# Patient Record
Sex: Female | Born: 1945
Health system: Southern US, Community
[De-identification: ages and names within clinical notes are randomized; demographics above are authoritative.]

---

## 1998-02-24 ENCOUNTER — Inpatient Hospital Stay (HOSPITAL_COMMUNITY): Admission: AD | Admit: 1998-02-24 | Discharge: 1998-02-26 | Payer: Self-pay | Admitting: Gastroenterology

## 1999-10-24 ENCOUNTER — Encounter: Payer: Self-pay | Admitting: Obstetrics and Gynecology

## 1999-10-24 ENCOUNTER — Other Ambulatory Visit: Admission: RE | Admit: 1999-10-24 | Discharge: 1999-10-24 | Payer: Self-pay | Admitting: Obstetrics and Gynecology

## 1999-10-24 ENCOUNTER — Encounter: Admission: RE | Admit: 1999-10-24 | Discharge: 1999-10-24 | Payer: Self-pay | Admitting: Obstetrics and Gynecology

## 2001-06-22 ENCOUNTER — Other Ambulatory Visit: Admission: RE | Admit: 2001-06-22 | Discharge: 2001-06-22 | Payer: Self-pay | Admitting: Gynecology

## 2001-10-26 ENCOUNTER — Encounter: Admission: RE | Admit: 2001-10-26 | Discharge: 2001-10-26 | Payer: Self-pay | Admitting: Gynecology

## 2001-10-26 ENCOUNTER — Encounter: Payer: Self-pay | Admitting: Gynecology

## 2001-11-02 ENCOUNTER — Encounter: Payer: Self-pay | Admitting: Gynecology

## 2001-11-02 ENCOUNTER — Encounter: Admission: RE | Admit: 2001-11-02 | Discharge: 2001-11-02 | Payer: Self-pay | Admitting: Gynecology

## 2002-06-28 ENCOUNTER — Other Ambulatory Visit: Admission: RE | Admit: 2002-06-28 | Discharge: 2002-06-28 | Payer: Self-pay | Admitting: Gynecology

## 2003-10-17 ENCOUNTER — Other Ambulatory Visit: Admission: RE | Admit: 2003-10-17 | Discharge: 2003-10-17 | Payer: Self-pay | Admitting: Gynecology

## 2003-11-07 ENCOUNTER — Encounter: Admission: RE | Admit: 2003-11-07 | Discharge: 2003-11-07 | Payer: Self-pay | Admitting: Gynecology

## 2003-11-21 ENCOUNTER — Encounter: Admission: RE | Admit: 2003-11-21 | Discharge: 2003-11-21 | Payer: Self-pay | Admitting: Gynecology

## 2003-11-21 IMAGING — CR DG CHEST 2V
2 series · 2 of 2 positions shown · non-contrast
Comparison: none

CLINICAL DATA: Gagging sensation, cough, recently started Synthroid.  
 CHEST X-RAY: 
 The heart size and mediastinal contours are normal. The lungs are clear. The visualized skeleton is unremarkable.

 IMPRESSION
 No active lung disease.

[view not recorded (1 of 2)]
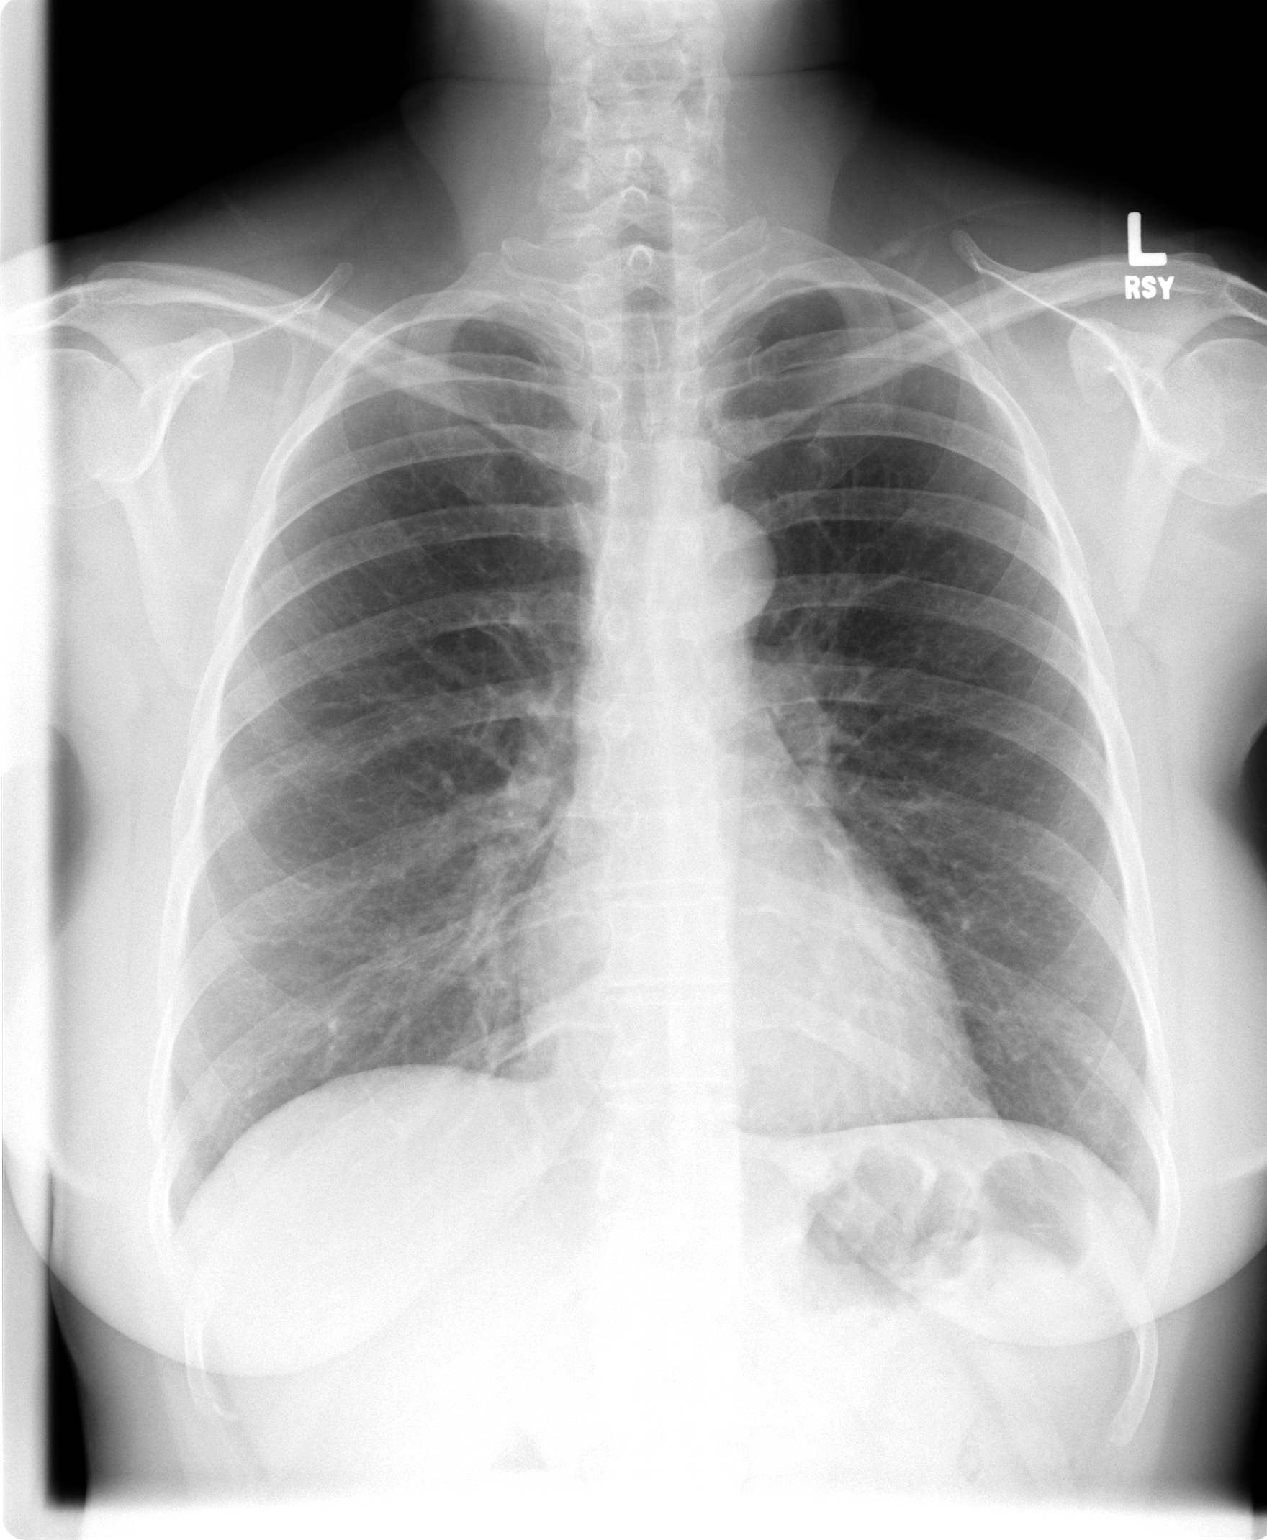

[view not recorded (2 of 2)]
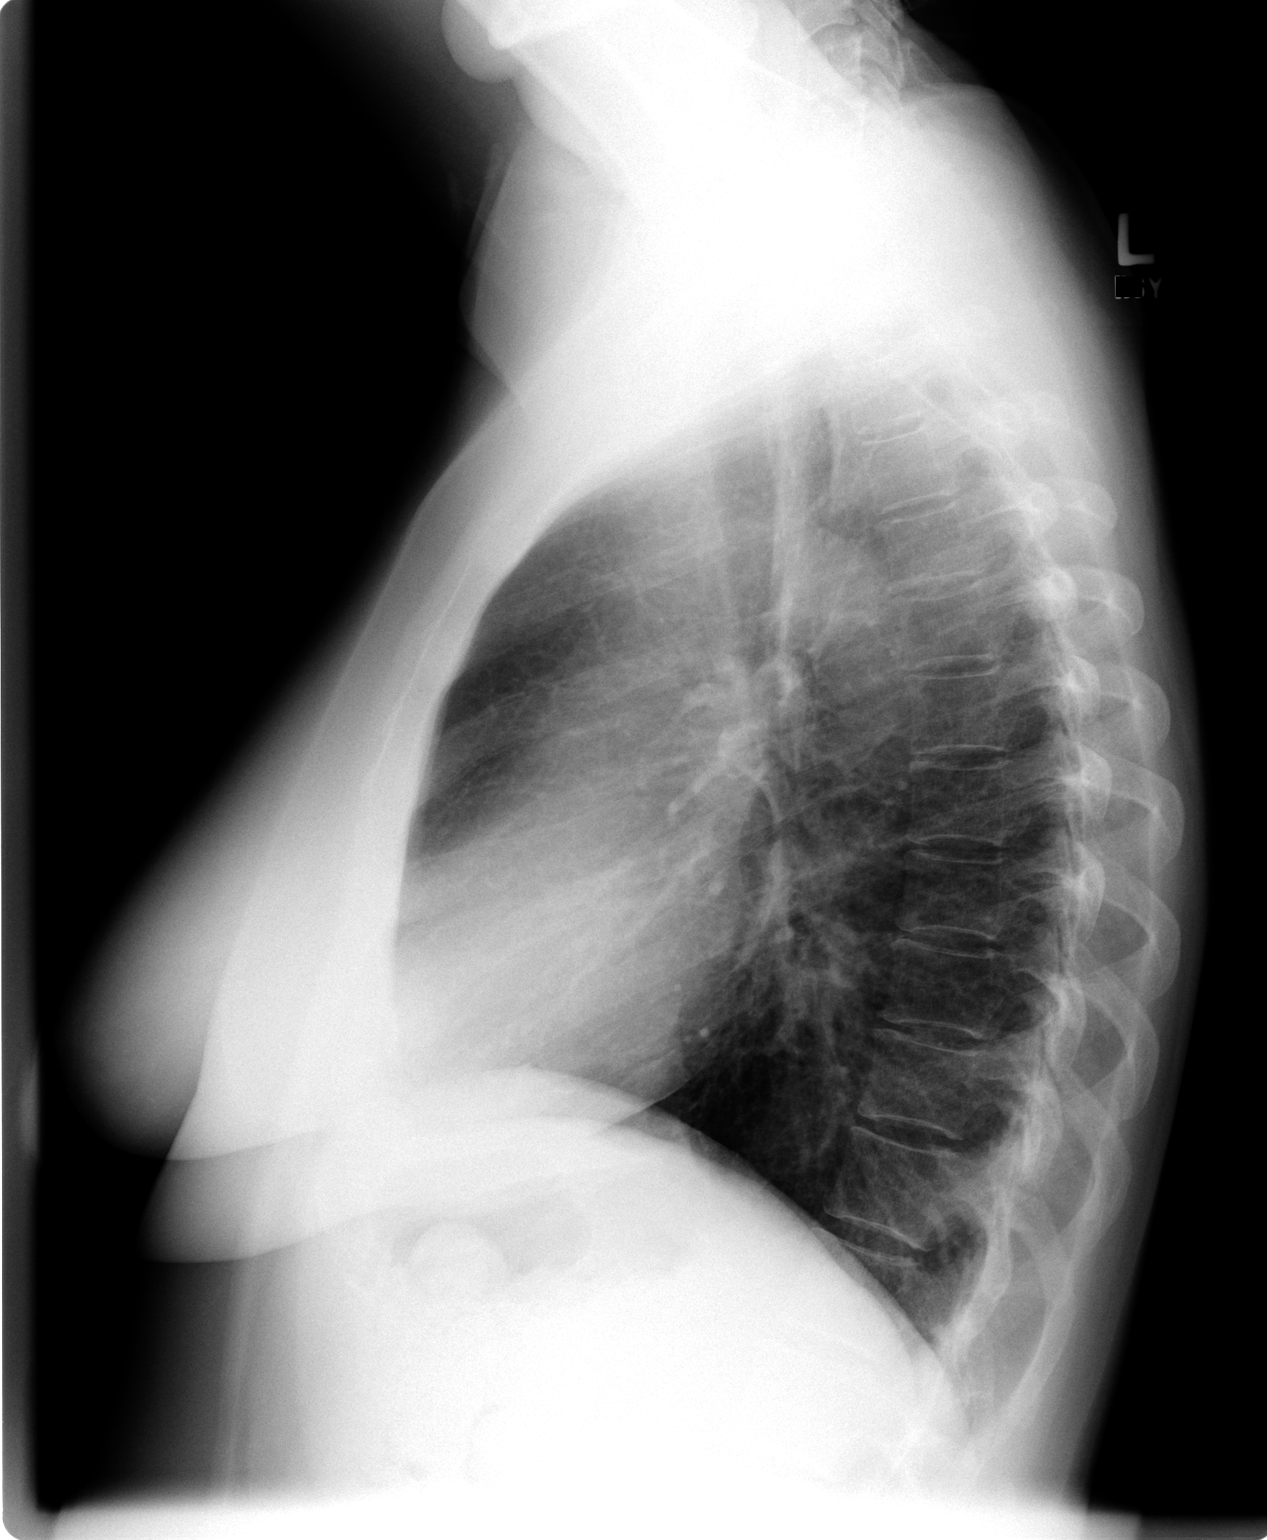

[2 of 2 positions shown; findings below may reference images not displayed]

## 2004-10-31 ENCOUNTER — Other Ambulatory Visit: Admission: RE | Admit: 2004-10-31 | Discharge: 2004-10-31 | Payer: Self-pay | Admitting: Gynecology

## 2004-11-19 ENCOUNTER — Encounter: Admission: RE | Admit: 2004-11-19 | Discharge: 2004-11-19 | Payer: Self-pay | Admitting: Gynecology

## 2005-11-14 ENCOUNTER — Other Ambulatory Visit: Admission: RE | Admit: 2005-11-14 | Discharge: 2005-11-14 | Payer: Self-pay | Admitting: Gynecology

## 2005-12-03 ENCOUNTER — Encounter: Admission: RE | Admit: 2005-12-03 | Discharge: 2005-12-03 | Payer: Self-pay | Admitting: Gynecology

## 2007-02-02 ENCOUNTER — Other Ambulatory Visit: Admission: RE | Admit: 2007-02-02 | Discharge: 2007-02-02 | Payer: Self-pay | Admitting: Gynecology

## 2007-12-22 ENCOUNTER — Ambulatory Visit (HOSPITAL_BASED_OUTPATIENT_CLINIC_OR_DEPARTMENT_OTHER): Admission: RE | Admit: 2007-12-22 | Discharge: 2007-12-23 | Payer: Self-pay | Admitting: Gynecology

## 2008-01-29 ENCOUNTER — Encounter: Admission: RE | Admit: 2008-01-29 | Discharge: 2008-01-29 | Payer: Self-pay | Admitting: Gynecology

## 2008-06-30 ENCOUNTER — Encounter: Admission: RE | Admit: 2008-06-30 | Discharge: 2008-06-30 | Payer: Self-pay | Admitting: Orthopaedic Surgery

## 2009-05-16 ENCOUNTER — Encounter: Admission: RE | Admit: 2009-05-16 | Discharge: 2009-05-16 | Payer: Self-pay | Admitting: Gynecology

## 2010-07-08 ENCOUNTER — Encounter: Payer: Self-pay | Admitting: Gynecology

## 2010-07-09 ENCOUNTER — Encounter: Payer: Self-pay | Admitting: Gynecology

## 2010-10-30 NOTE — Op Note (Signed)
NAMEBARRIE, Kristi              ACCOUNT NO.:  [id number]   MEDICAL RECORD NO.:  [id number]          PATIENT TYPE:  AMB   LOCATION:  [facility]                         FACILITY:  [facility]   PHYSICIAN:  Gretta Cool, M.D. DATE OF BIRTH:  18-Mar-1946   DATE OF PROCEDURE:  12/22/2007  DATE OF DISCHARGE:                               OPERATIVE REPORT   PREOPERATIVE DIAGNOSIS:  Pelvic organ prolapse with enterocele,  rectocele and fascial detachment.   POSTOPERATIVE DIAGNOSIS:  Pelvic organ prolapse with enterocele,  rectocele and fascial detachment.   PROCEDURES:  Posterior enterocele repairs and cardinal uterosacral  colposuspension.   SURGEON:  Gretta Cool, M.D.   ASSISTANT:  Luvenia Redden, M.D.   ANESTHESIA:  General orotracheal.   BRIEF HISTORY:  Kristi Burns is a 65 year old G3, P2 with history of very  long, difficult first labor with pushing for many hours, before Cesarean  section delivery.  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  She has seen urology and they have planned interval  procedure if she has persistent incontinence after repair of her  posterior support.  She is now admitted for definitive therapy of the  posterior vaginal wall concerns.   DESCRIPTION OF PROCEDURE:  Under excellent general anesthesia, as above,  with the patient prepped and draped in lithotomy position, Allen  stirrups, with Foley catheter draining her bladder, the posterior  vaginal walls were grasped with Allis clamps and the mucosa infiltrated  with Xylocaine 1% with epi 1:200,000.  At this point the mucosa was  undermined and incised to the apex of the vaginal cuff.  Allis clamps  were placed on the cut edge and the perirectal fascia dissected from the  mucosa.  A large, fascial defect was confirmed in the center of the  posterior vaginal wall, that extended two-thirds of the length of the  vagina.  At this point the cardinal uterosacral ligaments were  identified and grasped posteriorly as far as possible.  The sutures of 2-  0 Novofil were placed through the cardinal uterosacral complex and then  secured to the detached perirectal fascia.  The fascia was then pulled  to the cardinal uterosacral complex and secured.  At this point the  central portion of the fascial defect was sutured to the posterior  aspect of the cervix.  At this point the weak, central portion of the  perirectal fascia was plicated from the apex of the cuff to the  introitus with a running suture of 0 Vicryl.  A second suture of 2-0  Vicryl was used to plicate the upper layers of endopelvic fascia and  mucosa from the apex of the vaginal cuff to the introitus.  At the end  of the procedure, the posterior fascial defect was well repaired and  there was no evidence of any site of weakness.   At this point, the patient was returned to the recovery room in  excellent condition without complication.  ESTIMATED BLOOD LOSS:  100 cc.   TISSUE:  No tissue submitted to pathology.  The mucosal trimmings were  discarded.           ______________________________  Gretta Cool, M.D.     CWL/MEDQ  D:  12/22/2007  T:  12/22/2007  Job:  [id number]   cc:   Luvenia Redden, M.D.  Fax: [phone]   [facility]  [place], [place]

## 2011-03-14 LAB — POCT I-STAT 4, (NA,K, GLUC, HGB,HCT)
Hemoglobin: 13.6
Sodium: 141

## 2012-02-06 ENCOUNTER — Other Ambulatory Visit: Payer: Self-pay | Admitting: Gynecology

## 2012-02-06 DIAGNOSIS — Z1231 Encounter for screening mammogram for malignant neoplasm of breast: Secondary | ICD-10-CM

## 2012-03-02 ENCOUNTER — Ambulatory Visit
Admission: RE | Admit: 2012-03-02 | Discharge: 2012-03-02 | Disposition: A | Discharge status: Home / Self Care | Payer: Medicare Other | Source: Ambulatory Visit | Attending: Gynecology | Admitting: Gynecology

## 2012-03-02 DIAGNOSIS — Z1231 Encounter for screening mammogram for malignant neoplasm of breast: Secondary | ICD-10-CM

## 2012-03-03 ENCOUNTER — Other Ambulatory Visit: Payer: Self-pay | Admitting: Gynecology

## 2012-03-03 DIAGNOSIS — R928 Other abnormal and inconclusive findings on diagnostic imaging of breast: Secondary | ICD-10-CM

## 2012-03-12 ENCOUNTER — Ambulatory Visit
Admission: RE | Admit: 2012-03-12 | Discharge: 2012-03-12 | Disposition: A | Discharge status: Home / Self Care | Payer: Medicare Other | Source: Ambulatory Visit | Attending: Gynecology | Admitting: Gynecology

## 2012-03-12 DIAGNOSIS — R928 Other abnormal and inconclusive findings on diagnostic imaging of breast: Secondary | ICD-10-CM

## 2014-08-30 ENCOUNTER — Other Ambulatory Visit: Payer: Self-pay | Admitting: Internal Medicine

## 2014-08-30 ENCOUNTER — Other Ambulatory Visit: Payer: Self-pay

## 2014-08-30 DIAGNOSIS — R921 Mammographic calcification found on diagnostic imaging of breast: Secondary | ICD-10-CM

## 2014-08-30 DIAGNOSIS — Z1231 Encounter for screening mammogram for malignant neoplasm of breast: Secondary | ICD-10-CM

## 2014-11-07 ENCOUNTER — Other Ambulatory Visit: Payer: Self-pay | Admitting: Internal Medicine

## 2014-11-07 DIAGNOSIS — R921 Mammographic calcification found on diagnostic imaging of breast: Secondary | ICD-10-CM

## 2014-11-11 ENCOUNTER — Ambulatory Visit
Admission: RE | Admit: 2014-11-11 | Discharge: 2014-11-11 | Disposition: A | Discharge status: Home / Self Care | Payer: Medicare Other | Source: Ambulatory Visit | Attending: Internal Medicine | Admitting: Internal Medicine

## 2014-11-11 ENCOUNTER — Other Ambulatory Visit: Payer: Self-pay

## 2014-11-11 DIAGNOSIS — R921 Mammographic calcification found on diagnostic imaging of breast: Secondary | ICD-10-CM

## 2015-11-15 DIAGNOSIS — Z79899 Other long term (current) drug therapy: Secondary | ICD-10-CM

## 2015-11-15 DIAGNOSIS — R5381 Other malaise: Secondary | ICD-10-CM

## 2015-11-15 DIAGNOSIS — I10 Essential (primary) hypertension: Secondary | ICD-10-CM

## 2015-11-15 DIAGNOSIS — E782 Mixed hyperlipidemia: Secondary | ICD-10-CM

## 2015-11-15 DIAGNOSIS — F419 Anxiety disorder, unspecified: Secondary | ICD-10-CM

## 2015-11-15 DIAGNOSIS — I739 Peripheral vascular disease, unspecified: Secondary | ICD-10-CM

## 2015-11-15 DIAGNOSIS — E039 Hypothyroidism, unspecified: Secondary | ICD-10-CM

## 2015-11-15 HISTORY — DX: Mixed hyperlipidemia: E78.2

## 2015-11-15 HISTORY — DX: Anxiety disorder, unspecified: F41.9

## 2015-11-15 HISTORY — DX: Other long term (current) drug therapy: Z79.899

## 2015-11-15 HISTORY — DX: Peripheral vascular disease, unspecified: I73.9

## 2015-11-15 HISTORY — DX: Essential (primary) hypertension: I10

## 2015-11-15 HISTORY — DX: Hypothyroidism, unspecified: E03.9

## 2015-11-15 HISTORY — DX: Other malaise: R53.81

## 2017-01-31 ENCOUNTER — Encounter (INDEPENDENT_AMBULATORY_CARE_PROVIDER_SITE_OTHER): Payer: Self-pay

## 2017-01-31 ENCOUNTER — Encounter: Payer: Self-pay | Admitting: Sports Medicine

## 2017-01-31 ENCOUNTER — Ambulatory Visit (INDEPENDENT_AMBULATORY_CARE_PROVIDER_SITE_OTHER): Payer: Medicare Other | Admitting: Sports Medicine

## 2017-01-31 VITALS — BP 132/68 | HR 75

## 2017-01-31 DIAGNOSIS — L602 Onychogryphosis: Secondary | ICD-10-CM

## 2017-01-31 DIAGNOSIS — M79675 Pain in left toe(s): Secondary | ICD-10-CM

## 2017-01-31 DIAGNOSIS — L03032 Cellulitis of left toe: Secondary | ICD-10-CM | POA: Diagnosis not present

## 2017-01-31 DIAGNOSIS — B009 Herpesviral infection, unspecified: Secondary | ICD-10-CM

## 2017-01-31 HISTORY — DX: Herpesviral infection, unspecified: B00.9

## 2017-01-31 MED ORDER — AMOXICILLIN-POT CLAVULANATE 875-125 MG PO TABS: 1.0000 | ORAL_TABLET | Freq: Two times a day (BID) | ORAL | 0 refills | Status: DC

## 2017-01-31 NOTE — Progress Notes (Signed)
   Subjective:    Patient ID: Kristi Burns, female    DOB: Mar 11, 1946, 71 y.o.   MRN: [id number]  HPI    Review of Systems  Constitutional: Positive for fatigue.  HENT: Positive for hearing loss and rhinorrhea.   Respiratory: Positive for shortness of breath.   Gastrointestinal: Positive for nausea.  Genitourinary: Positive for frequency.  Musculoskeletal: Positive for back pain.  Skin:       Open sores  Neurological: Positive for dizziness.       Objective:   Physical Exam        Assessment & Plan:

## 2017-01-31 NOTE — Progress Notes (Signed)
Subjective: Kristi Burns is a 71 y.o. female patient presents to office today complaining of a painful incurvated, red, hot, swollen lateral nail border of the 1st toe on the left foot. This has been present for >1 month. Patient has treated this by self trimming. Reports that she wants the whole nail removed. Patient denies fever/chills/nausea/vomitting/any other related constitutional symptoms at this time.  There are no active problems to display for this patient.   No current outpatient prescriptions on file prior to visit.   No current facility-administered medications on file prior to visit.     Allergies  Allergen Reactions  . Codeine Other (See Comments)    unknown  . Other Other (See Comments)    Antihistamines    Objective:  There were no vitals filed for this visit.  General: Well developed, nourished, in no acute distress, alert and oriented x3   Dermatology: Skin is warm, dry and supple bilateral. Left hallux nail appears to be severely incurvated with hyperkeratosis formation at the distal aspects of the lateral nail border with large Ram horn appearance of nail. (+) Erythema. (+) Edema. (+) serosanguous drainage present. The right hallux nail is thick as well but asymptomatic, all remaining nails appear unremarkable at this time, free from acute ingrowning. There are no open sores, lesions or other signs of infection  present.  Vascular: Dorsalis Pedis artery and Posterior Tibial artery pedal pulses are 2/4 bilateral with immedate capillary fill time. Pedal hair growth present. No lower extremity edema.   Neruologic: Grossly intact via light touch bilateral.  Musculoskeletal: Tenderness to palpation of the Left hallux lateral nail fold and entire thick toenail. Muscular strength within normal limits in all groups bilateral.   Assesement and Plan: Problem List Items Addressed This Visit    None    Visit Diagnoses    Onychogryposis of toenail    -  Primary   Relevant Medications   amoxicillin-clavulanate (AUGMENTIN) 875-125 MG tablet   Paronychia of great toe, left       Relevant Medications   valACYclovir (VALTREX) 1000 MG tablet   amoxicillin-clavulanate (AUGMENTIN) 875-125 MG tablet   Toe pain, left       Relevant Medications   amoxicillin-clavulanate (AUGMENTIN) 875-125 MG tablet      -Discussed treatment alternatives and plan of care; Explained permanent/temporary nail avulsion and post procedure course to patient. Patient opt for total permanent removal of left hallux nail - After a verbal and written consent, injected 3 ml of a 50:50 mixture of 2% plain lidocaine and 0.5% plain marcaine in a normal hallux block fashion. Next, a betadine prep was performed. Anesthesia was tested and found to be appropriate.  The offending left hallux nail was then incised from the hyponychium to the epinychium. The offending nail was removed and cleared from the field. The area was curretted for any remaining nail or spicules. Phenol application performed and the area was then flushed with alcohol and dressed with antibiotic cream and a dry sterile dressing. -Patient was instructed to leave the dressing intact for today and begin soaking in a weak solution of betadine or Epsom salt and water tomorrow. Patient was instructed to soak for 15 minutes each day and apply neosporin and a gauze or bandaid dressing each day. -Patient was instructed to monitor the toe for signs of infection and return to office if toe becomes more red, hot or swollen. -Rx Augmentin  -Advised ice, elevation, and tylenol or motrin if needed for pain.  -Patient  is to return in 2-3 weeks for follow up care/nail check or sooner if problems arise.  Landis Martins, DPM

## 2017-01-31 NOTE — Patient Instructions (Signed)

## 2017-02-13 ENCOUNTER — Telehealth: Payer: Self-pay | Admitting: *Deleted

## 2017-02-13 NOTE — Telephone Encounter (Signed)
-----   Message from Landis Martins, [place] sent at 02/13/2017  4:58 PM EDT ----- Contact: [phone] Melody Have patient to switch to using betadine from epsom salt to the great toe for soaking. Let her know when the big toenail is removed it takes a long time to heal can take up to 8 weeks. Finish her Augmentin antibiotics  -Dr. Cannon Kettle ----- Message ----- From: Manfred Shirts Sent: 02/13/2017   2:45 PM To: Melody A Judyann Munson, DPM  Left foot great toe raw, weeping, she's soaking it, not healing.

## 2017-02-13 NOTE — Telephone Encounter (Signed)
Called patient and relayed Dr Leeanne Rio advice. Patient also stated she is having a lot of pain recommended ibuprofen and looking into the neosprin with lidocaine as a topical option.  Reminded patient to keep follow up appointment for next Thursday and call the office if she has any further questions or concerns patient stated understanding.

## 2017-02-20 ENCOUNTER — Encounter (INDEPENDENT_AMBULATORY_CARE_PROVIDER_SITE_OTHER): Payer: Self-pay

## 2017-02-20 ENCOUNTER — Ambulatory Visit (INDEPENDENT_AMBULATORY_CARE_PROVIDER_SITE_OTHER): Payer: Self-pay | Admitting: Sports Medicine

## 2017-02-20 DIAGNOSIS — Z9889 Other specified postprocedural states: Secondary | ICD-10-CM

## 2017-02-20 DIAGNOSIS — L602 Onychogryphosis: Secondary | ICD-10-CM

## 2017-02-20 DIAGNOSIS — L03032 Cellulitis of left toe: Secondary | ICD-10-CM

## 2017-02-20 DIAGNOSIS — M79675 Pain in left toe(s): Secondary | ICD-10-CM

## 2017-02-20 NOTE — Progress Notes (Signed)
Subjective: Kristi Burns is a 71 y.o. female patient returns to office today for follow up evaluation after having Left Hallux total permanent nail avulsion performed on 01-31-17. Patient has been soaking using betadine alternating with epsom salt and applying topical antibiotic covered with bandaid daily. Patient deniesfever/chills/nausea/vomitting/any other related constitutional symptoms at this time.  Patient completed Augmentin.  There are no active problems to display for this patient.   Current Outpatient Prescriptions on File Prior to Visit  Medication Sig Dispense Refill  . ALPRAZolam (XANAX) 0.5 MG tablet Take 0.5 mg by mouth.    Marland Kitchen amoxicillin-clavulanate (AUGMENTIN) 875-125 MG tablet Take 1 tablet by mouth 2 (two) times daily. 28 tablet 0  . aspirin EC 325 MG tablet Take 325 mg by mouth.    . cyclobenzaprine (FLEXERIL) 5 MG tablet Take 5 mg by mouth.    . DOCOSAHEXAENOIC ACID PO Take 1 g by mouth.    . hydrochlorothiazide (HYDRODIURIL) 25 MG tablet     . levothyroxine (SYNTHROID, LEVOTHROID) 75 MCG tablet     . medroxyPROGESTERone (PROVERA) 2.5 MG tablet     . Multiple Vitamin (MULTIVITAMIN) capsule Take by mouth.    . potassium chloride (MICRO-K) 10 MEQ CR capsule Take by mouth.    . sertraline (ZOLOFT) 50 MG tablet TAKE 1 TABLET BY MOUTH  DAILY    . vitamin B-12 (CYANOCOBALAMIN) 1000 MCG tablet Take by mouth.     No current facility-administered medications on file prior to visit.     Allergies  Allergen Reactions  . Codeine Other (See Comments)    unknown  . Other Other (See Comments)    Antihistamines    Objective:  General: Well developed, nourished, in no acute distress, alert and oriented x3   Dermatology: Skin is warm, dry and supple bilateral. Left hallux nail bed appears to be clean, dry, with mild granular tissue and surrounding eschar/scab. (-) Erythema. (-) Edema. (-) serosanguous drainage present. The remaining nails appear unremarkable at this time.  There are no other lesions or other signs of infection present.  Neurovascular status: Intact. No lower extremity swelling; No pain with calf compression bilateral.  Musculoskeletal: Decreased tenderness to palpation of the left hallux nail fold(s). Muscular strength within normal limits bilateral.   Assesement and Plan: Problem List Items Addressed This Visit    None    Visit Diagnoses    S/P nail surgery    -  Primary   Onychogryposis of toenail       Paronychia of great toe, left       Toe pain, left          -Examined patient  -Cleansed left hallux nail bed and gently scrubbed with peroxide and q-tip/curetted away eschar at site and applied antibiotic cream covered with bandaid.  -Discussed plan of care with patient. -Patient to continue soaking in a weak solution of Epsom salt alternating with Betadine as needed with warm water. Patient was instructed to soak for 15-20 minutes each day until the toe appears normal and there is no drainage, redness, tenderness, or swelling at the procedure site, and apply neosporin and a gauze or bandaid dressing each day as needed. May leave open to air at night. -Educated patient on long term care after nail surgery. -Patient was instructed to monitor the toe for reoccurrence and signs of infection; Patient advised to return to office or go to ER if toe becomes red, hot or swollen. -Patient is to return as needed or sooner if  problems arise.  Landis Martins, DPM

## 2017-02-20 NOTE — Patient Instructions (Signed)

## 2017-03-31 ENCOUNTER — Other Ambulatory Visit: Payer: Self-pay | Admitting: Internal Medicine

## 2017-03-31 DIAGNOSIS — R921 Mammographic calcification found on diagnostic imaging of breast: Secondary | ICD-10-CM

## 2017-04-01 ENCOUNTER — Telehealth: Payer: Self-pay | Admitting: Sports Medicine

## 2017-04-01 NOTE — Telephone Encounter (Signed)
Dr. Cannon Kettle removed a toenail from my left great toe. Its not healing right. It hurts really badly and I don't know if it is infected. Please call me back at [phone]. Thank you.

## 2017-04-01 NOTE — Telephone Encounter (Signed)
Cheryl nail surgery was back in August please schedule her to see Dr Cannon Kettle this week.  Tell me time and I will open it

## 2017-04-02 ENCOUNTER — Ambulatory Visit: Payer: Medicare Other | Admitting: Sports Medicine

## 2017-06-18 ENCOUNTER — Encounter: Payer: Self-pay | Admitting: Sports Medicine

## 2017-06-18 ENCOUNTER — Ambulatory Visit: Payer: Medicare Other | Admitting: Sports Medicine

## 2017-06-18 DIAGNOSIS — M79675 Pain in left toe(s): Secondary | ICD-10-CM | POA: Diagnosis not present

## 2017-06-18 DIAGNOSIS — L608 Other nail disorders: Secondary | ICD-10-CM

## 2017-06-18 NOTE — Progress Notes (Signed)
Subjective: Kristi Burns is a 72 y.o. female patient seen today in office with complaint of thickness and dark spot to left 2nd toenail. Patient denies history of Diabetes, Neuropathy, or Vascular disease or any known injury or trauma to nails. Patient has no other pedal complaints at this time.   There are no active problems to display for this patient.   Current Outpatient Medications on File Prior to Visit  Medication Sig Dispense Refill  . ALPRAZolam (XANAX) 0.5 MG tablet Take 0.5 mg by mouth.    Marland Kitchen amoxicillin-clavulanate (AUGMENTIN) 875-125 MG tablet Take 1 tablet by mouth 2 (two) times daily. 28 tablet 0  . aspirin EC 325 MG tablet Take 325 mg by mouth.    . cyclobenzaprine (FLEXERIL) 5 MG tablet Take 5 mg by mouth.    . DOCOSAHEXAENOIC ACID PO Take 1 g by mouth.    . hydrochlorothiazide (HYDRODIURIL) 25 MG tablet     . levothyroxine (SYNTHROID, LEVOTHROID) 75 MCG tablet     . medroxyPROGESTERone (PROVERA) 2.5 MG tablet     . Multiple Vitamin (MULTIVITAMIN) capsule Take by mouth.    . potassium chloride (MICRO-K) 10 MEQ CR capsule Take by mouth.    . sertraline (ZOLOFT) 50 MG tablet TAKE 1 TABLET BY MOUTH  DAILY    . vitamin B-12 (CYANOCOBALAMIN) 1000 MCG tablet Take by mouth.     No current facility-administered medications on file prior to visit.     Allergies  Allergen Reactions  . Codeine Other (See Comments)    unknown  . Other Other (See Comments)    Antihistamines    Objective: Physical Exam  General: Well developed, nourished, no acute distress, awake, alert and oriented x 3  Vascular: Dorsalis pedis artery 2/4 bilateral, Posterior tibial artery 2/4 bilateral, skin temperature warm to warm proximal to distal bilateral lower extremities, no varicosities, pedal hair present bilateral.  Neurological: Gross sensation present via light touch bilateral.   Dermatological: Skin is warm, dry, and supple bilateral, Left 2nd toenail has dry blood and is thick, and  discolored with mild subungal debris, no loosening of the nail, Left 1st toe nail bed well healed from avulsion procedure, all other nails short and thick with no acute symptoms, no webspace macerations present bilateral, no open lesions present bilateral, no callus/corns/hyperkeratotic tissue present bilateral. No signs of infection bilateral.  Musculoskeletal: No pain to left 2nd toe, no symptomatic boney deformities noted bilateral. Muscular strength within normal limits without painon range of motion. No pain with calf compression bilateral.  Assessment and Plan:  Problem List Items Addressed This Visit    None    Visit Diagnoses    Hemorrhage of nail    -  Primary   Toe pain, left         -Examined patient.  -Discussed treatment options for dry blood at left 2nd toenail -Mechanically filed nail and advised patient that blood will slowly grow out and that there is no additional treatment needed for 2nd toenail on left -Recommend good supportive shoes that allows space around toes  -Patient to return as needed or sooner if symptoms worsen.  Landis Martins, DPM

## 2019-09-23 DIAGNOSIS — L989 Disorder of the skin and subcutaneous tissue, unspecified: Secondary | ICD-10-CM | POA: Diagnosis not present

## 2019-09-23 DIAGNOSIS — M5442 Lumbago with sciatica, left side: Secondary | ICD-10-CM | POA: Diagnosis not present

## 2019-09-23 DIAGNOSIS — M5441 Lumbago with sciatica, right side: Secondary | ICD-10-CM | POA: Diagnosis not present

## 2019-09-23 DIAGNOSIS — G8929 Other chronic pain: Secondary | ICD-10-CM | POA: Diagnosis not present

## 2019-10-13 DIAGNOSIS — L821 Other seborrheic keratosis: Secondary | ICD-10-CM | POA: Diagnosis not present

## 2019-10-13 DIAGNOSIS — L578 Other skin changes due to chronic exposure to nonionizing radiation: Secondary | ICD-10-CM | POA: Diagnosis not present

## 2019-10-13 DIAGNOSIS — C44612 Basal cell carcinoma of skin of right upper limb, including shoulder: Secondary | ICD-10-CM | POA: Diagnosis not present

## 2019-10-29 DIAGNOSIS — M5416 Radiculopathy, lumbar region: Secondary | ICD-10-CM | POA: Diagnosis not present

## 2019-10-29 DIAGNOSIS — Z4789 Encounter for other orthopedic aftercare: Secondary | ICD-10-CM | POA: Diagnosis not present

## 2019-10-29 DIAGNOSIS — M7062 Trochanteric bursitis, left hip: Secondary | ICD-10-CM | POA: Diagnosis not present

## 2019-11-17 DIAGNOSIS — N959 Unspecified menopausal and perimenopausal disorder: Secondary | ICD-10-CM | POA: Diagnosis not present

## 2019-11-17 DIAGNOSIS — Z1231 Encounter for screening mammogram for malignant neoplasm of breast: Secondary | ICD-10-CM | POA: Diagnosis not present

## 2019-11-17 DIAGNOSIS — M85851 Other specified disorders of bone density and structure, right thigh: Secondary | ICD-10-CM | POA: Diagnosis not present

## 2019-11-18 DIAGNOSIS — I10 Essential (primary) hypertension: Secondary | ICD-10-CM | POA: Diagnosis not present

## 2019-11-18 DIAGNOSIS — E039 Hypothyroidism, unspecified: Secondary | ICD-10-CM | POA: Diagnosis not present

## 2019-11-18 DIAGNOSIS — Z79899 Other long term (current) drug therapy: Secondary | ICD-10-CM | POA: Diagnosis not present

## 2019-11-18 DIAGNOSIS — E785 Hyperlipidemia, unspecified: Secondary | ICD-10-CM | POA: Diagnosis not present

## 2019-11-22 DIAGNOSIS — M438X6 Other specified deforming dorsopathies, lumbar region: Secondary | ICD-10-CM | POA: Diagnosis not present

## 2019-11-22 DIAGNOSIS — M5115 Intervertebral disc disorders with radiculopathy, thoracolumbar region: Secondary | ICD-10-CM | POA: Diagnosis not present

## 2019-11-22 DIAGNOSIS — M4807 Spinal stenosis, lumbosacral region: Secondary | ICD-10-CM | POA: Diagnosis not present

## 2019-11-22 DIAGNOSIS — M5416 Radiculopathy, lumbar region: Secondary | ICD-10-CM | POA: Diagnosis not present

## 2019-11-22 DIAGNOSIS — M5116 Intervertebral disc disorders with radiculopathy, lumbar region: Secondary | ICD-10-CM | POA: Diagnosis not present

## 2019-11-22 DIAGNOSIS — Z981 Arthrodesis status: Secondary | ICD-10-CM | POA: Diagnosis not present

## 2019-11-25 DIAGNOSIS — M461 Sacroiliitis, not elsewhere classified: Secondary | ICD-10-CM | POA: Diagnosis not present

## 2019-11-25 DIAGNOSIS — M5416 Radiculopathy, lumbar region: Secondary | ICD-10-CM | POA: Diagnosis not present

## 2019-12-01 DIAGNOSIS — M461 Sacroiliitis, not elsewhere classified: Secondary | ICD-10-CM | POA: Diagnosis not present

## 2019-12-16 DIAGNOSIS — M5416 Radiculopathy, lumbar region: Secondary | ICD-10-CM | POA: Diagnosis not present

## 2019-12-22 DIAGNOSIS — M5442 Lumbago with sciatica, left side: Secondary | ICD-10-CM | POA: Diagnosis not present

## 2019-12-22 DIAGNOSIS — Z79899 Other long term (current) drug therapy: Secondary | ICD-10-CM | POA: Diagnosis not present

## 2019-12-22 DIAGNOSIS — M5441 Lumbago with sciatica, right side: Secondary | ICD-10-CM | POA: Diagnosis not present

## 2019-12-22 DIAGNOSIS — G8929 Other chronic pain: Secondary | ICD-10-CM | POA: Diagnosis not present

## 2019-12-31 DIAGNOSIS — M5416 Radiculopathy, lumbar region: Secondary | ICD-10-CM | POA: Diagnosis not present

## 2019-12-31 DIAGNOSIS — M48061 Spinal stenosis, lumbar region without neurogenic claudication: Secondary | ICD-10-CM | POA: Diagnosis not present

## 2020-02-16 DIAGNOSIS — M5441 Lumbago with sciatica, right side: Secondary | ICD-10-CM | POA: Diagnosis not present

## 2020-02-16 DIAGNOSIS — G8929 Other chronic pain: Secondary | ICD-10-CM | POA: Diagnosis not present

## 2020-02-16 DIAGNOSIS — M5442 Lumbago with sciatica, left side: Secondary | ICD-10-CM | POA: Diagnosis not present

## 2020-03-23 DIAGNOSIS — Z79899 Other long term (current) drug therapy: Secondary | ICD-10-CM | POA: Diagnosis not present

## 2020-03-23 DIAGNOSIS — E039 Hypothyroidism, unspecified: Secondary | ICD-10-CM | POA: Diagnosis not present

## 2020-03-23 DIAGNOSIS — I10 Essential (primary) hypertension: Secondary | ICD-10-CM | POA: Diagnosis not present

## 2020-03-23 DIAGNOSIS — M5442 Lumbago with sciatica, left side: Secondary | ICD-10-CM | POA: Diagnosis not present

## 2020-03-23 DIAGNOSIS — E785 Hyperlipidemia, unspecified: Secondary | ICD-10-CM | POA: Diagnosis not present

## 2020-04-18 DIAGNOSIS — G4710 Hypersomnia, unspecified: Secondary | ICD-10-CM | POA: Diagnosis not present

## 2020-04-18 DIAGNOSIS — R5383 Other fatigue: Secondary | ICD-10-CM | POA: Diagnosis not present

## 2020-04-18 DIAGNOSIS — R0681 Apnea, not elsewhere classified: Secondary | ICD-10-CM | POA: Diagnosis not present

## 2020-04-18 DIAGNOSIS — R0602 Shortness of breath: Secondary | ICD-10-CM | POA: Diagnosis not present

## 2020-04-18 DIAGNOSIS — G479 Sleep disorder, unspecified: Secondary | ICD-10-CM | POA: Diagnosis not present

## 2020-04-18 DIAGNOSIS — R0683 Snoring: Secondary | ICD-10-CM | POA: Diagnosis not present

## 2020-04-25 DIAGNOSIS — Z87891 Personal history of nicotine dependence: Secondary | ICD-10-CM | POA: Diagnosis not present

## 2020-05-25 DIAGNOSIS — M5441 Lumbago with sciatica, right side: Secondary | ICD-10-CM | POA: Diagnosis not present

## 2020-05-25 DIAGNOSIS — G8929 Other chronic pain: Secondary | ICD-10-CM | POA: Diagnosis not present

## 2020-05-25 DIAGNOSIS — M5442 Lumbago with sciatica, left side: Secondary | ICD-10-CM | POA: Diagnosis not present

## 2020-06-28 DIAGNOSIS — Z0000 Encounter for general adult medical examination without abnormal findings: Secondary | ICD-10-CM | POA: Diagnosis not present

## 2020-06-28 DIAGNOSIS — Z9181 History of falling: Secondary | ICD-10-CM | POA: Diagnosis not present

## 2020-08-11 DIAGNOSIS — R231 Pallor: Secondary | ICD-10-CM | POA: Diagnosis not present

## 2020-08-11 DIAGNOSIS — K5750 Diverticulosis of both small and large intestine without perforation or abscess without bleeding: Secondary | ICD-10-CM | POA: Diagnosis not present

## 2020-08-11 DIAGNOSIS — I248 Other forms of acute ischemic heart disease: Secondary | ICD-10-CM | POA: Diagnosis not present

## 2020-08-11 DIAGNOSIS — R079 Chest pain, unspecified: Secondary | ICD-10-CM | POA: Diagnosis not present

## 2020-08-11 DIAGNOSIS — Z87891 Personal history of nicotine dependence: Secondary | ICD-10-CM | POA: Diagnosis not present

## 2020-08-11 DIAGNOSIS — R0789 Other chest pain: Secondary | ICD-10-CM | POA: Diagnosis not present

## 2020-08-11 DIAGNOSIS — E785 Hyperlipidemia, unspecified: Secondary | ICD-10-CM | POA: Diagnosis not present

## 2020-08-12 DIAGNOSIS — I2510 Atherosclerotic heart disease of native coronary artery without angina pectoris: Secondary | ICD-10-CM | POA: Diagnosis not present

## 2020-08-12 DIAGNOSIS — M549 Dorsalgia, unspecified: Secondary | ICD-10-CM | POA: Diagnosis not present

## 2020-08-12 DIAGNOSIS — E876 Hypokalemia: Secondary | ICD-10-CM | POA: Diagnosis not present

## 2020-08-12 DIAGNOSIS — K5750 Diverticulosis of both small and large intestine without perforation or abscess without bleeding: Secondary | ICD-10-CM | POA: Diagnosis not present

## 2020-08-12 DIAGNOSIS — E785 Hyperlipidemia, unspecified: Secondary | ICD-10-CM | POA: Diagnosis not present

## 2020-08-12 DIAGNOSIS — R079 Chest pain, unspecified: Secondary | ICD-10-CM | POA: Diagnosis not present

## 2020-08-12 DIAGNOSIS — G8929 Other chronic pain: Secondary | ICD-10-CM | POA: Diagnosis not present

## 2020-08-12 DIAGNOSIS — F32A Depression, unspecified: Secondary | ICD-10-CM | POA: Diagnosis not present

## 2020-08-12 DIAGNOSIS — J449 Chronic obstructive pulmonary disease, unspecified: Secondary | ICD-10-CM | POA: Diagnosis not present

## 2020-08-12 DIAGNOSIS — N178 Other acute kidney failure: Secondary | ICD-10-CM | POA: Diagnosis not present

## 2020-08-12 DIAGNOSIS — E860 Dehydration: Secondary | ICD-10-CM | POA: Diagnosis not present

## 2020-08-12 DIAGNOSIS — N189 Chronic kidney disease, unspecified: Secondary | ICD-10-CM | POA: Diagnosis not present

## 2020-08-12 DIAGNOSIS — F419 Anxiety disorder, unspecified: Secondary | ICD-10-CM | POA: Diagnosis not present

## 2020-08-12 DIAGNOSIS — I214 Non-ST elevation (NSTEMI) myocardial infarction: Secondary | ICD-10-CM | POA: Diagnosis not present

## 2020-08-12 DIAGNOSIS — E039 Hypothyroidism, unspecified: Secondary | ICD-10-CM | POA: Diagnosis not present

## 2020-08-12 DIAGNOSIS — I248 Other forms of acute ischemic heart disease: Secondary | ICD-10-CM | POA: Diagnosis not present

## 2020-08-12 DIAGNOSIS — Z79899 Other long term (current) drug therapy: Secondary | ICD-10-CM | POA: Diagnosis not present

## 2020-08-12 DIAGNOSIS — Z7982 Long term (current) use of aspirin: Secondary | ICD-10-CM | POA: Diagnosis not present

## 2020-08-12 DIAGNOSIS — N289 Disorder of kidney and ureter, unspecified: Secondary | ICD-10-CM | POA: Diagnosis not present

## 2020-08-12 DIAGNOSIS — Z79890 Hormone replacement therapy (postmenopausal): Secondary | ICD-10-CM | POA: Diagnosis not present

## 2020-08-12 DIAGNOSIS — I10 Essential (primary) hypertension: Secondary | ICD-10-CM | POA: Diagnosis not present

## 2020-08-12 DIAGNOSIS — N179 Acute kidney failure, unspecified: Secondary | ICD-10-CM | POA: Diagnosis not present

## 2020-08-12 DIAGNOSIS — K219 Gastro-esophageal reflux disease without esophagitis: Secondary | ICD-10-CM | POA: Diagnosis not present

## 2020-08-12 DIAGNOSIS — Z87891 Personal history of nicotine dependence: Secondary | ICD-10-CM | POA: Diagnosis not present

## 2020-08-12 DIAGNOSIS — E782 Mixed hyperlipidemia: Secondary | ICD-10-CM | POA: Diagnosis not present

## 2020-08-12 DIAGNOSIS — I129 Hypertensive chronic kidney disease with stage 1 through stage 4 chronic kidney disease, or unspecified chronic kidney disease: Secondary | ICD-10-CM | POA: Diagnosis not present

## 2020-08-13 ENCOUNTER — Encounter (HOSPITAL_COMMUNITY): Payer: Self-pay | Admitting: Internal Medicine

## 2020-08-13 ENCOUNTER — Inpatient Hospital Stay (HOSPITAL_COMMUNITY)
Admission: AD | Admit: 2020-08-13 | Discharge: 2020-08-15 | DRG: 247 | Disposition: A | Discharge status: Home / Self Care | Payer: Medicare Other | Source: Other Acute Inpatient Hospital | Attending: Internal Medicine | Admitting: Internal Medicine

## 2020-08-13 ENCOUNTER — Other Ambulatory Visit: Payer: Self-pay

## 2020-08-13 DIAGNOSIS — I2510 Atherosclerotic heart disease of native coronary artery without angina pectoris: Secondary | ICD-10-CM | POA: Diagnosis present

## 2020-08-13 DIAGNOSIS — E785 Hyperlipidemia, unspecified: Secondary | ICD-10-CM | POA: Diagnosis not present

## 2020-08-13 DIAGNOSIS — N179 Acute kidney failure, unspecified: Secondary | ICD-10-CM

## 2020-08-13 DIAGNOSIS — N289 Disorder of kidney and ureter, unspecified: Secondary | ICD-10-CM | POA: Diagnosis present

## 2020-08-13 DIAGNOSIS — E782 Mixed hyperlipidemia: Secondary | ICD-10-CM | POA: Diagnosis not present

## 2020-08-13 DIAGNOSIS — I248 Other forms of acute ischemic heart disease: Secondary | ICD-10-CM | POA: Diagnosis not present

## 2020-08-13 DIAGNOSIS — F419 Anxiety disorder, unspecified: Secondary | ICD-10-CM | POA: Diagnosis present

## 2020-08-13 DIAGNOSIS — F32A Depression, unspecified: Secondary | ICD-10-CM | POA: Diagnosis not present

## 2020-08-13 DIAGNOSIS — E039 Hypothyroidism, unspecified: Secondary | ICD-10-CM | POA: Diagnosis present

## 2020-08-13 DIAGNOSIS — Z79890 Hormone replacement therapy (postmenopausal): Secondary | ICD-10-CM

## 2020-08-13 DIAGNOSIS — I10 Essential (primary) hypertension: Secondary | ICD-10-CM | POA: Diagnosis not present

## 2020-08-13 DIAGNOSIS — K219 Gastro-esophageal reflux disease without esophagitis: Secondary | ICD-10-CM | POA: Diagnosis not present

## 2020-08-13 DIAGNOSIS — I214 Non-ST elevation (NSTEMI) myocardial infarction: Principal | ICD-10-CM

## 2020-08-13 DIAGNOSIS — J449 Chronic obstructive pulmonary disease, unspecified: Secondary | ICD-10-CM | POA: Diagnosis not present

## 2020-08-13 DIAGNOSIS — Z87891 Personal history of nicotine dependence: Secondary | ICD-10-CM

## 2020-08-13 DIAGNOSIS — E876 Hypokalemia: Secondary | ICD-10-CM | POA: Diagnosis not present

## 2020-08-13 DIAGNOSIS — Z955 Presence of coronary angioplasty implant and graft: Secondary | ICD-10-CM

## 2020-08-13 DIAGNOSIS — I739 Peripheral vascular disease, unspecified: Secondary | ICD-10-CM

## 2020-08-13 DIAGNOSIS — Z7982 Long term (current) use of aspirin: Secondary | ICD-10-CM | POA: Diagnosis not present

## 2020-08-13 DIAGNOSIS — Z79899 Other long term (current) drug therapy: Secondary | ICD-10-CM

## 2020-08-13 DIAGNOSIS — R079 Chest pain, unspecified: Secondary | ICD-10-CM

## 2020-08-13 HISTORY — DX: Chronic obstructive pulmonary disease, unspecified: J44.9

## 2020-08-13 HISTORY — DX: Non-ST elevation (NSTEMI) myocardial infarction: I21.4

## 2020-08-13 HISTORY — DX: Chest pain, unspecified: R07.9

## 2020-08-13 HISTORY — DX: Acute kidney failure, unspecified: N17.9

## 2020-08-13 LAB — CBC WITH DIFFERENTIAL/PLATELET
Abs Immature Granulocytes: 0.04 10*3/uL (ref 0.00–0.07)
Basophils Absolute: 0.1 10*3/uL (ref 0.0–0.1)
Basophils Relative: 1 %
Eosinophils Absolute: 0.2 10*3/uL (ref 0.0–0.5)
Eosinophils Relative: 2 %
HCT: 34.2 % — ABNORMAL LOW (ref 36.0–46.0)
Hemoglobin: 11.3 g/dL — ABNORMAL LOW (ref 12.0–15.0)
Immature Granulocytes: 1 %
Lymphocytes Relative: 38 %
Lymphs Abs: 3.1 10*3/uL (ref 0.7–4.0)
MCH: 30.3 pg (ref 26.0–34.0)
MCHC: 33 g/dL (ref 30.0–36.0)
MCV: 91.7 fL (ref 80.0–100.0)
Monocytes Absolute: 0.8 10*3/uL (ref 0.1–1.0)
Monocytes Relative: 9 %
Neutro Abs: 4.1 10*3/uL (ref 1.7–7.7)
Neutrophils Relative %: 49 %
Platelets: 222 10*3/uL (ref 150–400)
RBC: 3.73 MIL/uL — ABNORMAL LOW (ref 3.87–5.11)
RDW: 14 % (ref 11.5–15.5)
WBC: 8.3 10*3/uL (ref 4.0–10.5)
nRBC: 0 % (ref 0.0–0.2)

## 2020-08-13 LAB — LIPID PANEL
Cholesterol: 129 mg/dL (ref 0–200)
HDL: 43 mg/dL (ref >40)
LDL Cholesterol: 70 mg/dL (ref 0–99)
Total CHOL/HDL Ratio: 3 RATIO
Triglycerides: 78 mg/dL (ref <150)
VLDL: 16 mg/dL (ref 0–40)

## 2020-08-13 LAB — COMPREHENSIVE METABOLIC PANEL
ALT: 7 U/L (ref 0–44)
AST: 28 U/L (ref 15–41)
Albumin: 3 g/dL — ABNORMAL LOW (ref 3.5–5.0)
Alkaline Phosphatase: 45 U/L (ref 38–126)
Anion gap: 9 (ref 5–15)
BUN: 11 mg/dL (ref 8–23)
CO2: 25 mmol/L (ref 22–32)
Calcium: 9.5 mg/dL (ref 8.9–10.3)
Chloride: 105 mmol/L (ref 98–111)
Creatinine, Ser: 1.09 mg/dL — ABNORMAL HIGH (ref 0.44–1.00)
GFR, Estimated: 54 mL/min — ABNORMAL LOW (ref >60)
Glucose, Bld: 102 mg/dL — ABNORMAL HIGH (ref 70–99)
Potassium: 3.5 mmol/L (ref 3.5–5.1)
Sodium: 139 mmol/L (ref 135–145)
Total Bilirubin: 0.7 mg/dL (ref 0.3–1.2)
Total Protein: 5.8 g/dL — ABNORMAL LOW (ref 6.5–8.1)

## 2020-08-13 LAB — TROPONIN I (HIGH SENSITIVITY)
Troponin I (High Sensitivity): 3426 ng/L (ref <18)
Troponin I (High Sensitivity): 3049 ng/L (ref <18)

## 2020-08-13 LAB — HEMOGLOBIN A1C
Hgb A1c MFr Bld: 5.6 % (ref 4.8–5.6)
Mean Plasma Glucose: 114.02 mg/dL

## 2020-08-13 LAB — MAGNESIUM: Magnesium: 1.7 mg/dL (ref 1.7–2.4)

## 2020-08-13 LAB — TSH: TSH: 7.05 u[IU]/mL — ABNORMAL HIGH (ref 0.350–4.500)

## 2020-08-13 MED ORDER — ONDANSETRON HCL 4 MG/2ML IJ SOLN: 4.0000 mg | Freq: Four times a day (QID) | INTRAMUSCULAR | Status: DC | PRN

## 2020-08-13 MED ORDER — LEVOTHYROXINE SODIUM 75 MCG PO TABS
75.0000 ug | ORAL_TABLET | Freq: Every day | ORAL | Status: DC
  Administered 2020-08-14 – 2020-08-15 (×2): 75 ug via ORAL
  Filled 2020-08-13 (×2): qty 1

## 2020-08-13 MED ORDER — ALPRAZOLAM 0.5 MG PO TABS
0.5000 mg | ORAL_TABLET | Freq: Two times a day (BID) | ORAL | Status: DC | PRN
  Administered 2020-08-13 – 2020-08-15 (×4): 0.5 mg via ORAL
  Filled 2020-08-13 (×4): qty 1

## 2020-08-13 MED ORDER — MAGNESIUM SULFATE 2 GM/50ML IV SOLN
2.0000 g | Freq: Once | INTRAVENOUS | Status: AC
  Administered 2020-08-13: 2 g via INTRAVENOUS
  Filled 2020-08-13: qty 50

## 2020-08-13 MED ORDER — ASPIRIN EC 81 MG PO TBEC
81.0000 mg | DELAYED_RELEASE_TABLET | Freq: Every day | ORAL | Status: DC
  Administered 2020-08-14: 81 mg via ORAL
  Filled 2020-08-13: qty 1

## 2020-08-13 MED ORDER — HEPARIN (PORCINE) 25000 UT/250ML-% IV SOLN
700.0000 [IU]/h | INTRAVENOUS | Status: DC
  Administered 2020-08-13: 900 [IU]/h via INTRAVENOUS
  Filled 2020-08-13: qty 250

## 2020-08-13 MED ORDER — METOPROLOL TARTRATE 25 MG PO TABS
25.0000 mg | ORAL_TABLET | Freq: Two times a day (BID) | ORAL | Status: DC
  Administered 2020-08-13 – 2020-08-14 (×2): 25 mg via ORAL
  Filled 2020-08-13 (×2): qty 1

## 2020-08-13 MED ORDER — VITAMIN B-12 1000 MCG PO TABS
1000.0000 ug | ORAL_TABLET | Freq: Every day | ORAL | Status: DC
  Administered 2020-08-14 – 2020-08-15 (×2): 1000 ug via ORAL
  Filled 2020-08-13 (×2): qty 1

## 2020-08-13 MED ORDER — ACETAMINOPHEN 325 MG PO TABS: 650.0000 mg | ORAL_TABLET | ORAL | Status: DC | PRN

## 2020-08-13 MED ORDER — ATORVASTATIN CALCIUM 80 MG PO TABS
80.0000 mg | ORAL_TABLET | Freq: Every day | ORAL | Status: DC
  Administered 2020-08-13 – 2020-08-15 (×3): 80 mg via ORAL
  Filled 2020-08-13 (×3): qty 1

## 2020-08-13 MED ORDER — NITROGLYCERIN 0.4 MG SL SUBL: 0.4000 mg | SUBLINGUAL_TABLET | SUBLINGUAL | Status: DC | PRN

## 2020-08-13 MED ORDER — CYCLOBENZAPRINE HCL 10 MG PO TABS: 5.0000 mg | ORAL_TABLET | Freq: Every evening | ORAL | Status: DC | PRN

## 2020-08-13 MED ORDER — SERTRALINE HCL 50 MG PO TABS: 50.0000 mg | ORAL_TABLET | Freq: Every day | ORAL | Status: DC

## 2020-08-13 MED ORDER — POTASSIUM CHLORIDE ER 10 MEQ PO TBCR
40.0000 meq | EXTENDED_RELEASE_TABLET | Freq: Once | ORAL | Status: AC
  Administered 2020-08-13: 40 meq via ORAL
  Filled 2020-08-13 (×2): qty 4

## 2020-08-13 NOTE — H&P (Addendum)
CARDIOLOGY ADMISSION NOTE  Patient ID: Kristi Burns MRN: [id number] DOB/AGE: 1945-10-19 75 y.o.  Admit date: 08/13/2020 Primary Physician - Primary Cardiologist   No primary care provider on file. Chief Complaint    Chest pain  ASSESSMENT AND PLAN:   Chest pain/NSTEMI HTN HLD Hypothyroidism Former smoker/COPD AKI (at OSH) Anxiety Hypokalemia and hypomag  Plan: - she is transferred from [place] for NSTEMI mx. Currently hemodynamically stable, chest pain free, tele shows TWI and EKG shows TWI in anterolateral leads(suspect she might have LAD dx).  She got therapeutic lovenox prior to departure (per family around 3pm), also on aspirin, statin  - will continue aspirin 81mg , put her on atorva 80mg  daily, metoprolol tart 25mg  BID. Start heparin gtt around 11pm tonight - get labs, trops, ekg and trend troponin. Get ECHO in am - NPO after MN for LHC/CA in am - continue home meds - patient very anxious and after I explained about NSTEMI- she became even more anxious and requesting xanax or ativan.  I have reviewed the risks, indications, and alternatives to angioplasty and stenting with the patient. Risks include but are not limited to bleeding, infection, vascular injury, stroke, myocardial infection, arrhythmia, kidney injury, radiation-related injury in the case of prolonged fluoroscopy use, emergency cardiac surgery, and death. The patient understands the risks of serious complication is low (<6%) and she agrees to proceed.   Addendum: Cr 1.09 (she did receive IVF at OSH as her cr was 1.6). will replace K, Kristi Burns.  --------------------------------------------------------------------------------------------  HPI: Kristi Burns is a 76 y/o female with PMH HTN, HLD, Hypothyroidism, anxiety, depression, GERD, former smoker/COPD presented to OSH with c/o chest pain  The patient presented to OSH on 2/25 with midsternal chest pain with radiation to the neck/jaw and upper  abdomen associated with diaphoresis. Her episode started in the morning when sitting on the couch and took a brief walk but resolved after rest. The pain recurred prompting to go to the ER. She had similar episode several months ago but did not seek medical attention. EMS gave her full dose aspirin but her chest tightness persisted and resolved after several hours after getting ativan as she was very anxious of what's going on.   At [facility]: EKG : TWI in anterior leads v1-v5, nsr.  Vitals stable Labs on admission: hb 12.5, plt 232, wbc 14.8, Na 137, k 3.2, cr 1.10 Flu panel and covid negative cxr- negative CTA chest/abd/pelvis: negative for aortic disection/aneurysm, neg for PE. Intermediate lobulated 6.3cm fluid density lesion within left kidney. Aortic atherosclerosis and emphysema  Trop 0.23->0.35->0.85. ECHO showed anteroseptal hypokinesis, EF50-55%. She got therapeutic lovenox prior to transfer. She was seen by Dr Geraldo Pitter (Cardiologist) and was admitted under hospitalist care. She is transferred for NSTEMI- LHC/CA Labs today at OSH shows Cr 1.60, trop 1.8 Per family- lovenox was given ~ 3pm  On arrival here, the patient is hemodynamically stable, chest pain free. Daughter at bedside.  EKG:  On arrival shows TWI in antero lateral leads and also in inferior leads  Past Medical History:  Diagnosis Date  . Chest pain 08/13/2020  . NSTEMI (non-ST elevated myocardial infarction) ([place]) 08/13/2020    History reviewed. No pertinent surgical history.  Allergies  Allergen Reactions  . Other Anxiety    Reaction to Antihistamines   No current facility-administered medications on file prior to encounter.   Current Outpatient Medications on File Prior to Encounter  Medication Sig Dispense Refill  . acetaminophen (TYLENOL) 500  MG tablet Take 1,000 mg by mouth 2 (two) times daily as needed (back pain). Take with ibuprofen    . ALPRAZolam (XANAX) 0.5 MG tablet Take 0.5 mg by mouth  daily as needed for anxiety.    . Ascorbic Acid (VITAMIN C PO) Take 1 tablet by mouth every morning.    Marland Kitchen aspirin EC 325 MG tablet Take 325 mg by mouth every morning.    Marland Kitchen BIOTIN PO Take 1 tablet by mouth every morning.    . Budesonide-Formoterol Fumarate (SYMBICORT IN) Inhale 2 puffs into the lungs daily as needed (shortness of breath).    . Calcium Carbonate-Vitamin D (CALCIUM-D PO) Take 1 tablet by mouth 3 (three) times daily with meals.    Marland Kitchen CALCIUM-MAGNESIUM-ZINC PO Take 1 tablet by mouth at bedtime.    . Cholecalciferol (VITAMIN D3 PO) Take 1 tablet by mouth every morning.    . Cyanocobalamin (VITAMIN B-12 PO) Take 1 tablet by mouth every morning.    . cyclobenzaprine (FLEXERIL) 5 MG tablet Take 5 mg by mouth at bedtime as needed for muscle spasms (back pain).    . hydrochlorothiazide (HYDRODIURIL) 25 MG tablet Take 25 mg by mouth every morning.    Marland Kitchen ibuprofen (ADVIL) 600 MG tablet Take 600 mg by mouth 2 (two) times daily as needed (back pain). Take with tylenol    . levothyroxine (SYNTHROID, LEVOTHROID) 75 MCG tablet Take 75 mcg by mouth every morning.    . medroxyPROGESTERone (PROVERA) 2.5 MG tablet Take 2.5 mg by mouth every morning.    Marland Kitchen POTASSIUM PO Take 1 tablet by mouth every morning.    . tiotropium (SPIRIVA) 18 MCG inhalation capsule Place 18 mcg into inhaler and inhale daily as needed (shortness of breath).    . sertraline (ZOLOFT) 50 MG tablet TAKE 1 TABLET BY MOUTH  DAILY (Patient not taking: No sig reported)     Social History   Socioeconomic History  . Marital status: Widowed    Spouse name: Not on file  . Number of children: Not on file  . Years of education: Not on file  . Highest education level: Not on file  Occupational History  . Not on file  Tobacco Use  . Smoking status: Never Smoker  . Smokeless tobacco: Never Used  Substance and Sexual Activity  . Alcohol use: No  . Drug use: No  . Sexual activity: Not on file  Other Topics Concern  . Not on file   Social History Narrative  . Not on file   Social Determinants of Health   Financial Resource Strain: Not on file  Food Insecurity: Not on file  Transportation Needs: Not on file  Physical Activity: Not on file  Stress: Not on file  Social Connections: Not on file  Intimate Partner Violence: Not on file    History reviewed. No pertinent family history.   Review of Systems: [y] = yes, [ ]  = no       General: Weight gain [] ; Weight loss [ ] ; Anorexia [ ] ; Fatigue [ ] ; Fever [ ] ; Chills [ ] ; Weakness [ ]     Cardiac: Chest pain/pressure [x ]; Resting SOB [ ] ; Exertional SOB [ ] ; Orthopnea [ ] ; Pedal Edema [ ] ; Palpitations [ ] ; Syncope [ ] ; Presyncope [ ] ; Paroxysmal nocturnal dyspnea[ ]     Pulmonary: Cough [ ] ; Wheezing[ ] ; Hemoptysis[ ] ; Sputum [ ] ; Snoring [ ]     GI: Vomiting[ ] ; Dysphagia[ ] ; Melena[ ] ; Hematochezia [ ] ; Heartburn[ ] ;  Abdominal pain [ ] ; Constipation [ ] ; Diarrhea [ ] ; BRBPR [ ]     GU: Hematuria[ ] ; Dysuria [ ] ; Nocturia[ ]   Vascular: Pain in legs with walking [ ] ; Pain in feet with lying flat [ ] ; Non-healing sores [ ] ; Stroke [ ] ; TIA [ ] ; Slurred speech [ ] ;    Neuro: Headaches[ ] ; Vertigo[ ] ; Seizures[ ] ; Paresthesias[ ] ;Blurred vision [ ] ; Diplopia [ ] ; Vision changes [ ]     Ortho/Skin: Arthritis [ ] ; Joint pain [ ] ; Muscle pain [ ] ; Joint swelling [ ] ; Back Pain [ ] ; Rash [ ]     Psych: Depression[ ] ; Anxiety[ ]     Heme: Bleeding problems [ ] ; Clotting disorders [ ] ; Anemia [ ]     Endocrine: Diabetes [ ] ; Thyroid dysfunction[ ]   Physical Exam: Blood pressure 124/70, pulse 78, temperature 98.2 F (36.8 C), temperature source Oral, resp. rate 15, height 5\' 2"  (1.575 m), weight 66.3 kg, SpO2 99 %.   GENERAL: Patient is afebrile, Vital signs reviewed, Well appearing, Patient appears comfortable, Alert and lucid. EYES: Normal inspection. HEENT:  normocephalic, atraumatic , normal ENT inspection. ORAL:  Moist NECK:  supple , normal inspection. CARD:   regular rate and rhythm, heart sounds normal. RESP:  no respiratory distress, breath sounds normal. ABD: soft, tender to palpation, BS present, soft, no organomegaly or masses . BACK: non-tender. No CVA tenderness. MUSC:  normal ROM, non-tender , no pedal edema . SKIN: color normal, no rash, warm, dry . NEURO: awake & alert, lucid, no motor/sensory deficit. Gait stable. PSYCH: mood/affect normal.   Labs: Lab Results  Component Value Date   BUN 11 08/13/2020   Lab Results  Component Value Date   CREATININE 1.09 (H) 08/13/2020   Lab Results  Component Value Date   NA 139 08/13/2020   K 3.5 08/13/2020   CL 105 08/13/2020   CO2 25 08/13/2020   No results found for: TROPONINI Lab Results  Component Value Date   WBC 8.3 08/13/2020   HGB 11.3 (L) 08/13/2020   HCT 34.2 (L) 08/13/2020   MCV 91.7 08/13/2020   PLT 222 08/13/2020   Lab Results  Component Value Date   CHOL 129 08/13/2020   HDL 43 08/13/2020   LDLCALC 70 08/13/2020   TRIG 78 08/13/2020   CHOLHDL 3.0 08/13/2020   Lab Results  Component Value Date   ALT 7 08/13/2020   AST 28 08/13/2020   ALKPHOS 45 08/13/2020   BILITOT 0.7 08/13/2020      Radiology:  CXR: at OSH- negative    Signed: Renae Fickle 08/13/2020, 8:24 PM

## 2020-08-13 NOTE — Progress Notes (Signed)
ANTICOAGULATION CONSULT NOTE - Initial Consult  Pharmacy Consult for Heparin Indication: chest pain/ACS  Allergies  Allergen Reactions  . Other Anxiety and Other (See Comments)    Antihistamines    Patient Measurements: Height: 5\' 2"  (157.5 cm) Weight: 66.3 kg (146 lb 1.6 oz) IBW/kg (Calculated) : 50.1 Heparin Dosing Weight: TBW  Vital Signs: Temp: 98.2 F (36.8 C) (02/27 1645) Temp Source: Oral (02/27 1645) BP: 108/67 (02/27 1645) Pulse Rate: 83 (02/27 1645)  Labs: Recent Labs    08/13/20 1831  HGB 11.3*  HCT 34.2*  PLT 222    CrCl cannot be calculated (No successful lab value found.).   Medical History: Past Medical History:  Diagnosis Date  . Chest pain 08/13/2020  . NSTEMI (non-ST elevated myocardial infarction) ([place]) 08/13/2020    Medications:  See electronic med rec  Assessment: 75 y.o. F presented to [facility] on 2/25 with CP. Pt started on Lovenox 70mg  SQ q12h - last dose 2/27 1500.  Labs from [place]: SCr 1.6, est CrCl 35-40 ml/min Hgb 12.1, Hct 36.1, plt 225  Pt transferred to [facility] for cath. To transition to heparin here with plans for cath tomorrow.  Goal of Therapy:  Heparin level 0.3-0.7 units/ml Monitor platelets by anticoagulation protocol: Yes   Plan:  At 2300 (8hr post last Lovenox dose), start heparin gtt at 900 units/hr. No bolus. Will f/u heparin level 8 hours post gtt start Daily heparin level and CBC  Sherlon Handing, PharmD, BCPS Please see amion for complete clinical pharmacist phone list 08/13/2020,7:15 PM

## 2020-08-14 ENCOUNTER — Inpatient Hospital Stay (HOSPITAL_COMMUNITY)
Admission: AD | Disposition: A | Discharge status: Home / Self Care | Payer: Self-pay | Source: Other Acute Inpatient Hospital | Attending: Internal Medicine

## 2020-08-14 ENCOUNTER — Other Ambulatory Visit (HOSPITAL_COMMUNITY): Payer: Medicare Other

## 2020-08-14 DIAGNOSIS — I2510 Atherosclerotic heart disease of native coronary artery without angina pectoris: Secondary | ICD-10-CM

## 2020-08-14 DIAGNOSIS — I10 Essential (primary) hypertension: Secondary | ICD-10-CM

## 2020-08-14 DIAGNOSIS — J449 Chronic obstructive pulmonary disease, unspecified: Secondary | ICD-10-CM

## 2020-08-14 DIAGNOSIS — N179 Acute kidney failure, unspecified: Secondary | ICD-10-CM | POA: Diagnosis not present

## 2020-08-14 DIAGNOSIS — I214 Non-ST elevation (NSTEMI) myocardial infarction: Principal | ICD-10-CM

## 2020-08-14 DIAGNOSIS — E782 Mixed hyperlipidemia: Secondary | ICD-10-CM

## 2020-08-14 HISTORY — PX: CORONARY STENT INTERVENTION: CATH118234

## 2020-08-14 HISTORY — PX: INTRAVASCULAR ULTRASOUND/IVUS: CATH118244

## 2020-08-14 HISTORY — PX: LEFT HEART CATH AND CORONARY ANGIOGRAPHY: CATH118249

## 2020-08-14 LAB — BASIC METABOLIC PANEL
Anion gap: 9 (ref 5–15)
BUN: 10 mg/dL (ref 8–23)
CO2: 24 mmol/L (ref 22–32)
Calcium: 9.4 mg/dL (ref 8.9–10.3)
Chloride: 109 mmol/L (ref 98–111)
Creatinine, Ser: 0.93 mg/dL (ref 0.44–1.00)
GFR, Estimated: >60 mL/min (ref >60)
Glucose, Bld: 91 mg/dL (ref 70–99)
Potassium: 4 mmol/L (ref 3.5–5.1)
Sodium: 142 mmol/L (ref 135–145)

## 2020-08-14 LAB — CBC
HCT: 34 % — ABNORMAL LOW (ref 36.0–46.0)
Hemoglobin: 11.1 g/dL — ABNORMAL LOW (ref 12.0–15.0)
MCH: 30.2 pg (ref 26.0–34.0)
MCHC: 32.6 g/dL (ref 30.0–36.0)
MCV: 92.4 fL (ref 80.0–100.0)
Platelets: 223 10*3/uL (ref 150–400)
RBC: 3.68 MIL/uL — ABNORMAL LOW (ref 3.87–5.11)
RDW: 14 % (ref 11.5–15.5)
WBC: 7.7 10*3/uL (ref 4.0–10.5)
nRBC: 0 % (ref 0.0–0.2)

## 2020-08-14 LAB — POCT ACTIVATED CLOTTING TIME
Activated Clotting Time: 291 seconds
Activated Clotting Time: 327 seconds

## 2020-08-14 LAB — HEPARIN LEVEL (UNFRACTIONATED): Heparin Unfractionated: 0.91 IU/mL — ABNORMAL HIGH (ref 0.30–0.70)

## 2020-08-14 SURGERY — LEFT HEART CATH AND CORONARY ANGIOGRAPHY
Anesthesia: LOCAL

## 2020-08-14 MED ORDER — SODIUM CHLORIDE 0.9 % WEIGHT BASED INFUSION: 1.0000 mL/kg/h | INTRAVENOUS | Status: DC

## 2020-08-14 MED ORDER — SODIUM CHLORIDE 0.9% FLUSH: 3.0000 mL | INTRAVENOUS | Status: DC | PRN

## 2020-08-14 MED ORDER — VERAPAMIL HCL 2.5 MG/ML IV SOLN
INTRAVENOUS | Status: DC | PRN
Start: 2020-08-14 — End: 2020-08-14
  Administered 2020-08-14: 2 mg via INTRAVENOUS

## 2020-08-14 MED ORDER — ANGIOPLASTY BOOK
Freq: Once | Status: AC
  Filled 2020-08-14: qty 1

## 2020-08-14 MED ORDER — UMECLIDINIUM BROMIDE 62.5 MCG/INH IN AEPB: 1.0000 | INHALATION_SPRAY | Freq: Every day | RESPIRATORY_TRACT | Status: DC | PRN

## 2020-08-14 MED ORDER — SODIUM CHLORIDE 0.9 % IV SOLN: INTRAVENOUS | Status: AC

## 2020-08-14 MED ORDER — SODIUM CHLORIDE 0.9 % IV SOLN: 250.0000 mL | INTRAVENOUS | Status: DC | PRN

## 2020-08-14 MED ORDER — SODIUM CHLORIDE 0.9% FLUSH
3.0000 mL | Freq: Two times a day (BID) | INTRAVENOUS | Status: DC
  Administered 2020-08-14: 3 mL via INTRAVENOUS

## 2020-08-14 MED ORDER — LIDOCAINE HCL (PF) 1 % IJ SOLN
INTRAMUSCULAR | Status: DC | PRN
  Administered 2020-08-14 (×2): 2 mL

## 2020-08-14 MED ORDER — TICAGRELOR 90 MG PO TABS
ORAL_TABLET | ORAL | Status: AC
  Filled 2020-08-14: qty 2

## 2020-08-14 MED ORDER — TICAGRELOR 90 MG PO TABS
90.0000 mg | ORAL_TABLET | Freq: Two times a day (BID) | ORAL | Status: DC
  Administered 2020-08-15 (×2): 90 mg via ORAL
  Filled 2020-08-14 (×2): qty 1

## 2020-08-14 MED ORDER — VERAPAMIL HCL 2.5 MG/ML IV SOLN
INTRAVENOUS | Status: AC
  Filled 2020-08-14: qty 2

## 2020-08-14 MED ORDER — BUDESONIDE-FORMOTEROL FUMARATE 80-4.5 MCG/ACT IN AERO: 2.0000 | INHALATION_SPRAY | Freq: Every day | RESPIRATORY_TRACT | Status: DC | PRN

## 2020-08-14 MED ORDER — NITROGLYCERIN 1 MG/10 ML FOR IR/CATH LAB
INTRA_ARTERIAL | Status: AC
  Filled 2020-08-14: qty 10

## 2020-08-14 MED ORDER — LABETALOL HCL 5 MG/ML IV SOLN: 10.0000 mg | INTRAVENOUS | Status: AC | PRN

## 2020-08-14 MED ORDER — MIDAZOLAM HCL 2 MG/2ML IJ SOLN
INTRAMUSCULAR | Status: AC
  Filled 2020-08-14: qty 2

## 2020-08-14 MED ORDER — LIDOCAINE HCL (PF) 1 % IJ SOLN
INTRAMUSCULAR | Status: AC
  Filled 2020-08-14: qty 30

## 2020-08-14 MED ORDER — HYDRALAZINE HCL 20 MG/ML IJ SOLN: 10.0000 mg | INTRAMUSCULAR | Status: AC | PRN

## 2020-08-14 MED ORDER — ONDANSETRON HCL 4 MG/2ML IJ SOLN: 4.0000 mg | Freq: Four times a day (QID) | INTRAMUSCULAR | Status: DC | PRN

## 2020-08-14 MED ORDER — HEPARIN SODIUM (PORCINE) 1000 UNIT/ML IJ SOLN
INTRAMUSCULAR | Status: DC | PRN
  Administered 2020-08-14: 3500 [IU] via INTRAVENOUS
  Administered 2020-08-14: 4500 [IU] via INTRAVENOUS
  Administered 2020-08-14: 2000 [IU] via INTRAVENOUS

## 2020-08-14 MED ORDER — TICAGRELOR 90 MG PO TABS
ORAL_TABLET | ORAL | Status: DC | PRN
  Administered 2020-08-14: 180 mg via ORAL

## 2020-08-14 MED ORDER — NITROGLYCERIN 1 MG/10 ML FOR IR/CATH LAB
INTRA_ARTERIAL | Status: DC | PRN
  Administered 2020-08-14: 200 ug via INTRACORONARY
  Administered 2020-08-14: 400 ug via INTRA_ARTERIAL

## 2020-08-14 MED ORDER — ACETAMINOPHEN 500 MG PO TABS: 1000.0000 mg | ORAL_TABLET | Freq: Two times a day (BID) | ORAL | Status: DC | PRN

## 2020-08-14 MED ORDER — HEPARIN (PORCINE) IN NACL 1000-0.9 UT/500ML-% IV SOLN
INTRAVENOUS | Status: AC
  Filled 2020-08-14: qty 1000

## 2020-08-14 MED ORDER — MIDAZOLAM HCL 2 MG/2ML IJ SOLN
INTRAMUSCULAR | Status: DC | PRN
  Administered 2020-08-14 (×3): 1 mg via INTRAVENOUS

## 2020-08-14 MED ORDER — IOHEXOL 350 MG/ML SOLN
INTRAVENOUS | Status: DC | PRN
  Administered 2020-08-14: 110 mL

## 2020-08-14 MED ORDER — SODIUM CHLORIDE 0.9 % WEIGHT BASED INFUSION
3.0000 mL/kg/h | INTRAVENOUS | Status: DC
  Administered 2020-08-14: 3 mL/kg/h via INTRAVENOUS

## 2020-08-14 MED ORDER — HEPARIN SODIUM (PORCINE) 1000 UNIT/ML IJ SOLN
INTRAMUSCULAR | Status: AC
  Filled 2020-08-14: qty 1

## 2020-08-14 MED ORDER — ACETAMINOPHEN 325 MG PO TABS: 650.0000 mg | ORAL_TABLET | ORAL | Status: DC | PRN

## 2020-08-14 MED ORDER — FENTANYL CITRATE (PF) 100 MCG/2ML IJ SOLN
INTRAMUSCULAR | Status: DC | PRN
  Administered 2020-08-14 (×2): 25 ug via INTRAVENOUS

## 2020-08-14 MED ORDER — IBUPROFEN 600 MG PO TABS: 600.0000 mg | ORAL_TABLET | Freq: Two times a day (BID) | ORAL | Status: DC | PRN

## 2020-08-14 MED ORDER — ASPIRIN 81 MG PO CHEW
81.0000 mg | CHEWABLE_TABLET | Freq: Every day | ORAL | Status: DC
  Administered 2020-08-15: 81 mg via ORAL
  Filled 2020-08-14: qty 1

## 2020-08-14 MED ORDER — VERAPAMIL HCL 2.5 MG/ML IV SOLN
INTRAVENOUS | Status: DC | PRN
  Administered 2020-08-14: 10 mL via INTRA_ARTERIAL

## 2020-08-14 MED ORDER — FENTANYL CITRATE (PF) 100 MCG/2ML IJ SOLN
INTRAMUSCULAR | Status: AC
  Filled 2020-08-14: qty 2

## 2020-08-14 MED ORDER — SODIUM CHLORIDE 0.9% FLUSH
3.0000 mL | Freq: Two times a day (BID) | INTRAVENOUS | Status: DC
  Administered 2020-08-15: 3 mL via INTRAVENOUS

## 2020-08-14 MED ORDER — HEART ATTACK BOUNCING BOOK
Freq: Once | Status: AC
  Filled 2020-08-14: qty 1

## 2020-08-14 SURGICAL SUPPLY — 22 items
BAG SNAP BAND KOVER 36X36 (MISCELLANEOUS) ×2 IMPLANT
BALLN SAPPHIRE 2.5X20 (BALLOONS) ×2
BALLN SAPPHIRE ~~LOC~~ 3.5X18 (BALLOONS) ×1 IMPLANT
BALLOON SAPPHIRE 2.5X20 (BALLOONS) ×1 IMPLANT
CATH 5FR JL3.5 JR4 ANG PIG MP (CATHETERS) ×1 IMPLANT
CATH LAUNCHER 6FR EBU3.5 (CATHETERS) ×1 IMPLANT
CATH OPTICROSS HD (CATHETERS) ×1 IMPLANT
COVER DOME SNAP 22 D (MISCELLANEOUS) ×1 IMPLANT
DEVICE RAD COMP TR BAND LRG (VASCULAR PRODUCTS) ×1 IMPLANT
GLIDESHEATH SLEND SS 6F .021 (SHEATH) ×1 IMPLANT
GUIDEWIRE INQWIRE 1.5J.035X260 (WIRE) IMPLANT
INQWIRE 1.5J .035X260CM (WIRE) ×2
KIT ENCORE 26 ADVANTAGE (KITS) ×1 IMPLANT
KIT HEART LEFT (KITS) ×2 IMPLANT
KIT HEMO VALVE WATCHDOG (MISCELLANEOUS) ×2 IMPLANT
PACK CARDIAC CATHETERIZATION (CUSTOM PROCEDURE TRAY) ×2 IMPLANT
SHEATH PROBE COVER 6X72 (BAG) ×1 IMPLANT
SLED PULL BACK IVUS (MISCELLANEOUS) ×1 IMPLANT
STENT RESOLUTE ONYX 3.0X30 (Permanent Stent) ×1 IMPLANT
TRANSDUCER W/STOPCOCK (MISCELLANEOUS) ×2 IMPLANT
TUBING CIL FLEX 10 FLL-RA (TUBING) ×2 IMPLANT
WIRE ASAHI PROWATER 180CM (WIRE) ×1 IMPLANT

## 2020-08-14 NOTE — Progress Notes (Signed)
[place] for Heparin Indication: chest pain/ACS  Allergies  Allergen Reactions  . Other Anxiety    Reaction to Antihistamines    Patient Measurements: Height: 5\' 2"  (157.5 cm) Weight: 66.3 kg (146 lb 1.6 oz) IBW/kg (Calculated) : 50.1 Heparin Dosing Weight: TBW  Vital Signs: Temp: 98.2 F (36.8 C) (02/28 0826) Temp Source: Oral (02/28 0826) BP: 122/66 (02/28 0826) Pulse Rate: 62 (02/28 0826)  Labs: Recent Labs    08/13/20 1831 08/13/20 2031 08/14/20 0730  HGB 11.3*  --  11.1*  HCT 34.2*  --  34.0*  PLT 222  --  223  HEPARINUNFRC  --   --  0.91*  CREATININE 1.09*  --  0.93  TROPONINIHS 3,426* 3,049*  --     Estimated Creatinine Clearance: 48.1 mL/min (by C-G formula based on SCr of 0.93 mg/dL).   Medical History: Past Medical History:  Diagnosis Date  . Chest pain 08/13/2020  . NSTEMI (non-ST elevated myocardial infarction) ([place]) 08/13/2020     Assessment: 75 y.o. F presented to [facility] on 2/25 with CP. Pt started on Lovenox 70mg  SQ q12h - last dose 2/27 1500.  Labs from [place]: SCr 1.6, est CrCl 35-40 ml/min Hgb 12.1, Hct 36.1, plt 225  Pt transferred to [facility] for cath.   Heparin level above goal this morning (0.91), hgb stable 11s and plt wnl. No bleeding issues noted.   Goal of Therapy:  Heparin level 0.3-0.7 units/ml Monitor platelets by anticoagulation protocol: Yes   Plan:  Reduce IV heparin to 700 units/hr Follow up cards plan  Follow up heparin level this afternoon if no procedures  Erin Hearing PharmD., BCPS Clinical Pharmacist 08/14/2020 8:34 AM

## 2020-08-14 NOTE — Progress Notes (Addendum)
The patient has been seen in conjunction with Harlan Stains, NP. All aspects of care have been considered and discussed. The patient has been personally interviewed, examined, and all clinical data has been reviewed.   Stable and awaiting coronary angiography.  Elevated cardiac markers consistent with ACS.  Has been stable since admission to the hospital.  Cath and risks have been reviewed, again with the patient who has no complaints or questions.  Kidney function has improved with hydration.     Progress Note  Patient Name: Kristi Burns Date of Encounter: 08/14/2020  [facility] Cardiologist: No primary care provider on file.   Subjective   No chest pain this morning.   Inpatient Medications    Scheduled Meds: . aspirin EC  81 mg Oral Daily  . atorvastatin  80 mg Oral Daily  . levothyroxine  75 mcg Oral Q0600  . metoprolol tartrate  25 mg Oral BID  . vitamin B-12  1,000 mcg Oral Daily   Continuous Infusions: . heparin 700 Units/hr (08/14/20 0927)   PRN Meds: acetaminophen, ALPRAZolam, cyclobenzaprine, nitroGLYCERIN, ondansetron (ZOFRAN) IV   Vital Signs    Vitals:   08/13/20 2337 08/14/20 0400 08/14/20 0500 08/14/20 0826  BP: 115/64  121/64 122/66  Pulse: 68 61 65 62  Resp: 20 15 18 17   Temp: 97.9 F (36.6 C)  98.1 F (36.7 C) 98.2 F (36.8 C)  TempSrc: Oral  Oral Oral  SpO2: 98% 95% 96% 97%  Weight:      Height:        Intake/Output Summary (Last 24 hours) at 08/14/2020 0958 Last data filed at 08/14/2020 0500 Gross per 24 hour  Intake 489.32 ml  Output --  Net 489.32 ml   Last 3 Weights 08/13/2020  Weight (lbs) 146 lb 1.6 oz  Weight (kg) 66.271 kg      Telemetry    SR, did have a 5.56 sec pause on telemetry last night - Personally Reviewed  ECG    SR 62bpm with TWI in anterolateral leads - Personally Reviewed  Physical Exam   GEN: No acute distress.   Neck: No JVD Cardiac: RRR, no murmurs, rubs, or gallops.  Respiratory:  Clear to auscultation bilaterally. GI: Soft, nontender, non-distended  MS: No edema; No deformity. Neuro:  Nonfocal  Psych: Normal affect   Labs    High Sensitivity Troponin:   Recent Labs  Lab 08/13/20 1831 08/13/20 2031  TROPONINIHS 3,426* 3,049*      Chemistry Recent Labs  Lab 08/13/20 1831 08/14/20 0730  NA 139 142  K 3.5 4.0  CL 105 109  CO2 25 24  GLUCOSE 102* 91  BUN 11 10  CREATININE 1.09* 0.93  CALCIUM 9.5 9.4  PROT 5.8*  --   ALBUMIN 3.0*  --   AST 28  --   ALT 7  --   ALKPHOS 45  --   BILITOT 0.7  --   GFRNONAA 54* >60  ANIONGAP 9 9     Hematology Recent Labs  Lab 08/13/20 1831 08/14/20 0730  WBC 8.3 7.7  RBC 3.73* 3.68*  HGB 11.3* 11.1*  HCT 34.2* 34.0*  MCV 91.7 92.4  MCH 30.3 30.2  MCHC 33.0 32.6  RDW 14.0 14.0  PLT 222 223    BNPNo results for input(s): BNP, PROBNP in the last 168 hours.   DDimer No results for input(s): DDIMER in the last 168 hours.   Radiology    No results found.  Cardiac Studies  Echo: at [facility]  Patient Profile     75 y.o. female with PMH of HTN, HLD, Hypothyroidism, anxiety, depression, GERD, former tobacco use, and COPD who presented to [facility] and found to have a NSTEMI.   Assessment & Plan    1. NSTEMI: troponin I up to 1.8 prior to transfer. Has been placed on IV heparin. No further chest pain since arrival. Had echo done at [facility] with EF of 50-55% with mid/apical anteroseptal hypokinesis. EKG noted TWI in anterolateral leads.  -- planned for cardiac cath today -- The patient understands that risks included but are not limited to stroke (1 in 1000), death (1 in 1000), kidney failure [usually temporary] (1 in 500), bleeding (1 in 200), allergic reaction [possibly serious] (1 in 200).  -- on ASA, statin, BB   2. HTN: was on HCTZ prior to admission. This has been held and started on metoprolol 25mg  BID. See below  3. HLD: LDL 106, HDL 43 -- now on high dose statin   4.  Hypothyroidism: on synthroid 75mg  daily  5. Left kidney lobulated density lesion:  Follow up with PCP for recommended MRI  6. Sinus pause: 5.56 sec pause on telemetry. Will stop BB this morning  For questions or updates, please contact [place] Please consult [email or web address] for contact info under        Signed, Reino Bellis, NP  08/14/2020, 9:58 AM    I have examined the patient and reviewed assessment and plan and discussed with patient.  Agree with above as stated.  NSTEMI based on troponin.  Abnormal ECG.  Patient agreeable to cath. Further plans based on cath result.   Larae Grooms

## 2020-08-14 NOTE — Plan of Care (Signed)
  Problem: Clinical Measurements: Goal: Ability to maintain clinical measurements within normal limits will improve Outcome: Progressing   Problem: Activity: Goal: Risk for activity intolerance will decrease Outcome: Progressing   Problem: Nutrition: Goal: Adequate nutrition will be maintained Outcome: Progressing   Problem: Coping: Goal: Level of anxiety will decrease Outcome: Progressing   Problem: Education: Goal: Understanding of CV disease, CV risk reduction, and recovery process will improve Outcome: Progressing   Problem: Cardiovascular: Goal: Ability to achieve and maintain adequate cardiovascular perfusion will improve Outcome: Progressing Goal: Vascular access site(s) Level 0-1 will be maintained Outcome: Progressing

## 2020-08-15 ENCOUNTER — Encounter (HOSPITAL_COMMUNITY): Payer: Self-pay | Admitting: Interventional Cardiology

## 2020-08-15 ENCOUNTER — Other Ambulatory Visit: Payer: Self-pay | Admitting: Cardiology

## 2020-08-15 DIAGNOSIS — N179 Acute kidney failure, unspecified: Secondary | ICD-10-CM | POA: Diagnosis not present

## 2020-08-15 DIAGNOSIS — E782 Mixed hyperlipidemia: Secondary | ICD-10-CM | POA: Diagnosis not present

## 2020-08-15 DIAGNOSIS — I10 Essential (primary) hypertension: Secondary | ICD-10-CM | POA: Diagnosis not present

## 2020-08-15 DIAGNOSIS — I214 Non-ST elevation (NSTEMI) myocardial infarction: Secondary | ICD-10-CM | POA: Diagnosis not present

## 2020-08-15 LAB — CBC
HCT: 35.7 % — ABNORMAL LOW (ref 36.0–46.0)
Hemoglobin: 11.6 g/dL — ABNORMAL LOW (ref 12.0–15.0)
MCH: 30.4 pg (ref 26.0–34.0)
MCHC: 32.5 g/dL (ref 30.0–36.0)
MCV: 93.5 fL (ref 80.0–100.0)
Platelets: 204 10*3/uL (ref 150–400)
RBC: 3.82 MIL/uL — ABNORMAL LOW (ref 3.87–5.11)
RDW: 14.1 % (ref 11.5–15.5)
WBC: 8.2 10*3/uL (ref 4.0–10.5)
nRBC: 0 % (ref 0.0–0.2)

## 2020-08-15 LAB — BASIC METABOLIC PANEL
Anion gap: 10 (ref 5–15)
BUN: 12 mg/dL (ref 8–23)
CO2: 22 mmol/L (ref 22–32)
Calcium: 9 mg/dL (ref 8.9–10.3)
Chloride: 107 mmol/L (ref 98–111)
Creatinine, Ser: 0.96 mg/dL (ref 0.44–1.00)
GFR, Estimated: >60 mL/min (ref >60)
Glucose, Bld: 121 mg/dL — ABNORMAL HIGH (ref 70–99)
Potassium: 3.6 mmol/L (ref 3.5–5.1)
Sodium: 139 mmol/L (ref 135–145)

## 2020-08-15 MED ORDER — ASPIRIN 81 MG PO CHEW: 81.0000 mg | CHEWABLE_TABLET | Freq: Every day | ORAL | 1 refills | Status: AC

## 2020-08-15 MED ORDER — ATORVASTATIN CALCIUM 80 MG PO TABS: 80.0000 mg | ORAL_TABLET | Freq: Every day | ORAL | 1 refills | Status: DC

## 2020-08-15 MED ORDER — NITROGLYCERIN 0.4 MG SL SUBL: 0.4000 mg | SUBLINGUAL_TABLET | SUBLINGUAL | 2 refills | Status: DC | PRN

## 2020-08-15 MED ORDER — LISINOPRIL 5 MG PO TABS: 5.0000 mg | ORAL_TABLET | Freq: Every day | ORAL | 0 refills | Status: DC

## 2020-08-15 MED ORDER — TICAGRELOR 90 MG PO TABS: 90.0000 mg | ORAL_TABLET | Freq: Two times a day (BID) | ORAL | 2 refills | Status: DC

## 2020-08-15 MED ORDER — LISINOPRIL 5 MG PO TABS
5.0000 mg | ORAL_TABLET | Freq: Every day | ORAL | Status: DC
  Administered 2020-08-15: 5 mg via ORAL
  Filled 2020-08-15: qty 1

## 2020-08-15 MED FILL — NITROGLYCERIN 0.4 MG TAB SL: 0.4 | 14 days supply | Qty: 25 | Fill #0

## 2020-08-15 MED FILL — ASPIRIN LOW DOSE 81 MG CHEW: 81 | 90 days supply | Qty: 90 | Fill #0

## 2020-08-15 MED FILL — LISINOPRIL 5 MG TABS: 5 | 90 days supply | Qty: 90 | Fill #0

## 2020-08-15 MED FILL — BRILINTA 90 MG TABLET: 90 | 30 days supply | Qty: 60 | Fill #0

## 2020-08-15 MED FILL — ATORVASTATIN CALCIUM 80 MG: 80 | 90 days supply | Qty: 90 | Fill #0

## 2020-08-15 NOTE — Progress Notes (Incomplete)
TR BAND REMOVAL  LOCATION:    right radial  DEFLATED PER PROTOCOL:    Yes.    TIME BAND OFF / DRESSING APPLIED:   2040  SITE UPON ARRIVAL:    Level {NUMBERS; 0-3:21082}  SITE AFTER BAND REMOVAL:    Level {NUMBERS; 0-3:21082}  CIRCULATION SENSATION AND MOVEMENT:    Within Normal Limits   {yes no:314532}  COMMENTS:   ***

## 2020-08-15 NOTE — Care Management (Signed)
[id number] 08-15-20 Benefits check submitted for [employer]. Case Manager will follow for cost. Graves-Bigelow, Ocie Cornfield, RN,BSN Case Manager

## 2020-08-15 NOTE — Discharge Summary (Addendum)
The patient has been seen in conjunction with Kristi Stains, NP. All aspects of care have been considered and discussed. The patient has been personally interviewed, examined, and all clinical data has been reviewed.   LAD drug-eluting stent with success performed yesterday by Dr. Irish Burns.  Right femoral cath site is unremarkable.  Patient is ambulated without difficulty.  Plan discharge home medication regimen listed below with plans to follow-up with Dr. Geraldo Burns on 08/29/2020  Discharge Summary    Patient ID: Kristi Burns MRN: [id number]; DOB: 23-Nov-1945  Admit date: 08/13/2020 Discharge date: 08/15/2020  PCP:  Kristi Mina., MD   Kristi Burns  Cardiologist:  Kristi Lindau, MD  Advanced Practice Provider:  No care team member to Burns Electrophysiologist:  None   Discharge Diagnoses    Principal Problem:   NSTEMI (non-ST elevated myocardial infarction) [facility]) Active Problems:   Chest pain   Essential hypertension   Hyperlipidemia   COPD (chronic obstructive pulmonary disease) ([place])   AKI (acute kidney injury) ([place])   Anxiety  Diagnostic Studies/Procedures    Cath: 08/14/20   Mid LAD-1 lesion is 95% stenosed. A drug-eluting stent was successfully placed using a STENT RESOLUTE ONYX 3.0X30, postdilated to > 3.5 mm, and optimized with IVUS.  Post intervention, there is a 0% residual stenosis.  Mid LAD-2 lesion is 25% stenosed.  The left ventricular systolic function is normal.  LV end diastolic pressure is normal. LVEDP 14 mm Hg.  The left ventricular ejection fraction is 55-65% by visual estimate.  There is Kristi aortic valve stenosis.  A drug-eluting stent was successfully placed using a STENT RESOLUTE ONYX 3.0X30.   Continue aggressive secondary prevention.    I spoke with her daughter, Kristi Burns, conveying the results.   Diagnostic Dominance: Right    Intervention    Echo: Done at [facility] prior to  admission. Showed EF of 50-55% with mid/apical anteroseptal hypokinesis.  _____________   History of Present Illness     Kristi Burns is a 75 y.o. female with PMH HTN, HLD, Hypothyroidism, anxiety, depression, GERD, former smoker/COPD presented to OSH with c/o chest pain.  The patient presented to OSH on 2/25 with midsternal chest pain with radiation to the neck/jaw and upper abdomen associated with diaphoresis. Her episode started in the morning when sitting on the couch and took a brief walk but resolved after rest. The pain recurred prompting to go to the ER. She had similar episode several months ago but did not seek medical attention. EMS gave her full dose aspirin but her chest tightness persisted and resolved after several hours after getting ativan as she was very anxious of what's going on.   At [facility]: EKG : TWI in anterior leads v1-v5, nsr.  Vitals stable Labs on admission: hb 12.5, plt 232, wbc 14.8, Na 137, k 3.2, cr 1.10 Flu panel and covid negative cxr- negative CTA chest/abd/pelvis: negative for aortic disection/aneurysm, neg for PE. Intermediate lobulated 6.3cm fluid density lesion within left kidney. Aortic atherosclerosis and emphysema  Trop 0.23->0.35->0.85. ECHO showed anteroseptal hypokinesis, EF50-55%. She got therapeutic lovenox prior to transfer. She was seen by Dr Kristi Burns (Cardiologist) and was admitted under hospitalist care. She was transferred for NSTEMI- LHC/CA Labs today at OSH shows Cr 1.60, trop 1.8  On arrival here, the patient was hemodynamically stable, chest pain free. Daughter at bedside.   Hospital Course     1. NSTEMI: troponin I up to 1.8 prior to transfer. Was  placed on IV heparin. Had echo done at [facility] with EF of 50-55% with mid/apical anteroseptal hypokinesis. EKG noted TWI in anterolateral leads. Underwent cardiac cath noted above with mLAD stenosis of 95% with PCI/DES x1. Placed on DAPT with ASA/Brilinta for at least one  year. Worked well with cardiac rehab without recurrent chest pain.  -- continue on ASA, Brilinta and atorvastatin 80mg  daily  2. HTN: was on HCTZ prior to admission. This has been held and started on metoprolol 25mg  BID. Developed significant 5.5 second pause and this was stopped.  -- will plan for lisinopril 5mg  daily at discharge   3. HLD: LDL 106, HDL 43 -- started on atorvastatin 80mg  daily -- FLP/LFTs in 8 weeks  4. Hypothyroidism: on synthroid 75mg  daily -- TSH 7.050, instructed to follow up with PCP for ongoing management  5. Left kidney lobulated density lesion:  Follow up with PCP for recommended MRI as an outpatient  6. Sinus pause: 5.56 sec pause on telemetry while on metoprolol. Kristi recurrence after stopped.   General: Well developed, well nourished, female appearing in Kristi acute distress. Head: Normocephalic, atraumatic.  Neck: Supple without bruits, JVD. Lungs:  Resp regular and unlabored, CTA. Heart: RRR, S1, S2, Kristi S3, S4, or murmur; Kristi rub. Abdomen: Soft, non-tender, non-distended with normoactive bowel sounds. Kristi hepatomegaly. Kristi rebound/guarding. Kristi obvious abdominal masses. Extremities: Kristi clubbing, cyanosis, edema. Distal pedal pulses are 2+ bilaterally. Right radial cath site stable without bruising or hematoma Neuro: Alert and oriented X 3. Moves all extremities spontaneously. Psych: Normal affect.   Did the patient have an acute coronary syndrome (MI, NSTEMI, STEMI, etc) this admission?:  Yes                               AHA/ACC Clinical Performance & Quality Measures: 5. Aspirin prescribed? - Yes 6. ADP Receptor Inhibitor (Plavix/Clopidogrel, Brilinta/Ticagrelor or Effient/Prasugrel) prescribed (includes medically managed patients)? - Yes 7. Beta Blocker prescribed? - Kristi - sinus pause on telemetry 8. High Intensity Statin (Lipitor 40-80mg  or Crestor 20-40mg ) prescribed? - Yes 9. EF assessed during THIS hospitalization? - Yes 10. For EF <40%, was  ACEI/ARB prescribed? - Not Applicable (EF >/= 01%) 11. For EF <40%, Aldosterone Antagonist (Spironolactone or Eplerenone) prescribed? - Not Applicable (EF >/= 60%) 12. Cardiac Rehab Phase II ordered (including medically managed patients)? - Yes       _____________  Discharge Vitals Blood pressure 130/68, pulse 78, temperature 98.2 F (36.8 C), temperature source Oral, resp. rate 16, height 5\' 2"  (1.575 m), weight 67.7 kg, SpO2 97 %.  Filed Weights   08/13/20 1645 08/14/20 1124 08/15/20 0552  Weight: 66.3 kg 65.5 kg 67.7 kg    Labs & Radiologic Studies    CBC Recent Labs    08/13/20 1831 08/14/20 0730 08/15/20 0240  WBC 8.3 7.7 8.2  NEUTROABS 4.1  --   --   HGB 11.3* 11.1* 11.6*  HCT 34.2* 34.0* 35.7*  MCV 91.7 92.4 93.5  PLT 222 223 109   Basic Metabolic Panel Recent Labs    08/13/20 1831 08/14/20 0730 08/15/20 0240  NA 139 142 139  K 3.5 4.0 3.6  CL 105 109 107  CO2 25 24 22   GLUCOSE 102* 91 121*  BUN 11 10 12   CREATININE 1.09* 0.93 0.96  CALCIUM 9.5 9.4 9.0  MG 1.7  --   --    Liver Function Tests Recent Labs    08/13/20  1831  AST 28  ALT 7  ALKPHOS 45  BILITOT 0.7  PROT 5.8*  ALBUMIN 3.0*   Kristi results for input(s): LIPASE, AMYLASE in the last 72 hours. High Sensitivity Troponin:   Recent Labs  Lab 08/13/20 1831 08/13/20 2031  TROPONINIHS 3,426* 3,049*    BNP Invalid input(s): POCBNP D-Dimer Kristi results for input(s): DDIMER in the last 72 hours. Hemoglobin A1C Recent Labs    08/13/20 1831  HGBA1C 5.6   Fasting Lipid Panel Recent Labs    08/13/20 1831  CHOL 129  HDL 43  LDLCALC 70  TRIG 78  CHOLHDL 3.0   Thyroid Function Tests Recent Labs    08/13/20 1831  TSH 7.050*   _____________  CARDIAC CATHETERIZATION  Result Date: 08/14/2020  Mid LAD-1 lesion is 95% stenosed. A drug-eluting stent was successfully placed using a STENT RESOLUTE ONYX 3.0X30, postdilated to > 3.5 mm, and optimized with IVUS.  Post intervention, there  is a 0% residual stenosis.  Mid LAD-2 lesion is 25% stenosed.  The left ventricular systolic function is normal.  LV end diastolic pressure is normal. LVEDP 14 mm Hg.  The left ventricular ejection fraction is 55-65% by visual estimate.  There is Kristi aortic valve stenosis.  A drug-eluting stent was successfully placed using a STENT RESOLUTE ONYX 3.0X30.  Continue aggressive secondary prevention.  I spoke with her daughter, Kristi Burns, conveying the results.   Disposition   Pt is being discharged home today in good condition.  Follow-up Plans & Appointments     Follow-up Information    Revankar, Reita Cliche, MD Follow up on 08/29/2020.   Specialty: Cardiology Why: at 1:40pm for your follow up appt Contact information: [address] [place] [address] [phone]        Kristi Mina., MD Follow up.   Specialty: Internal Medicine Why: Follow up regarding density noted on left kidney and thyroid management Contact information: [place] [address] [phone]              Discharge Instructions    Amb Referral to Cardiac Rehabilitation   Complete by: As directed    To [place]   Diagnosis:  Coronary Stents NSTEMI PTCA     After initial evaluation and assessments completed: Virtual Based Care may be provided alone or in conjunction with Phase 2 Cardiac Rehab based on patient barriers.: Yes   Call MD for:  difficulty breathing, headache or visual disturbances   Complete by: As directed    Call MD for:  persistant dizziness or light-headedness   Complete by: As directed    Call MD for:  redness, tenderness, or signs of infection (pain, swelling, redness, odor or green/yellow discharge around incision site)   Complete by: As directed    Diet - low sodium heart healthy   Complete by: As directed    Discharge instructions   Complete by: As directed    Radial Site Care Refer to this sheet in the next few weeks. These instructions provide  you with information on caring for yourself after your procedure. Your caregiver may also give you more specific instructions. Your treatment has been planned according to current medical practices, but problems sometimes occur. Call your caregiver if you have any problems or questions after your procedure. HOME CARE INSTRUCTIONS You may shower the day after the procedure.Remove the bandage (dressing) and gently wash the site with plain soap and water.Gently pat the site dry.  Do not apply  powder or lotion to the site.  Do not submerge the affected site in water for 3 to 5 days.  Inspect the site at least twice daily.  Do not flex or bend the affected arm for 24 hours.  Kristi lifting over 5 pounds (2.3 kg) for 5 days after your procedure.  Do not drive home if you are discharged the same day of the procedure. Have someone else drive you.  You may drive 24 hours after the procedure unless otherwise instructed by your caregiver.  What to expect: Any bruising will usually fade within 1 to 2 weeks.  Blood that collects in the tissue (hematoma) may be painful to the touch. It should usually decrease in size and tenderness within 1 to 2 weeks.  SEEK IMMEDIATE MEDICAL CARE IF: You have unusual pain at the radial site.  You have redness, warmth, swelling, or pain at the radial site.  You have drainage (other than a small amount of blood on the dressing).  You have chills.  You have a fever or persistent symptoms for more than 72 hours.  You have a fever and your symptoms suddenly get worse.  Your arm becomes pale, cool, tingly, or numb.  You have heavy bleeding from the site. Hold pressure on the site.   PLEASE DO NOT MISS ANY DOSES OF YOUR BRILINTA!!!!! Also keep a log of you blood pressures and bring back to your follow up appt. Please call the office with any questions.   Patients taking blood thinners should generally stay away from medicines like ibuprofen, Advil, Motrin, naproxen, and Aleve due  to risk of stomach bleeding. You may take Tylenol as directed or talk to your primary doctor about alternatives.   PLEASE ENSURE THAT YOU DO NOT RUN OUT OF YOUR BRILINTA. This medication is very important to remain on for at least one year. IF you have issues obtaining this medication due to cost please CALL the office 3-5 business days prior to running out in order to prevent missing doses of this medication.   Increase activity slowly   Complete by: As directed       Discharge Medications   Allergies as of 08/15/2020      Reactions   Other Anxiety   Reaction to Antihistamines      Medication List    STOP taking these medications   aspirin EC 325 MG tablet Replaced by: aspirin 81 MG chewable tablet   hydrochlorothiazide 25 MG tablet Commonly known as: HYDRODIURIL   ibuprofen 600 MG tablet Commonly known as: ADVIL   sertraline 50 MG tablet Commonly known as: ZOLOFT     TAKE these medications   acetaminophen 500 MG tablet Commonly known as: TYLENOL Take 1,000 mg by mouth 2 (two) times daily as needed (back pain). Take with ibuprofen   ALPRAZolam 0.5 MG tablet Commonly known as: XANAX Take 0.5 mg by mouth daily as needed for anxiety.   aspirin 81 MG chewable tablet Chew 1 tablet (81 mg total) by mouth daily. Start taking on: August 16, 2020 Replaces: aspirin EC 325 MG tablet   atorvastatin 80 MG tablet Commonly known as: LIPITOR Take 1 tablet (80 mg total) by mouth daily. Start taking on: August 16, 2020   BIOTIN PO Take 1 tablet by mouth every morning.   CALCIUM-D PO Take 1 tablet by mouth 3 (three) times daily with meals.   CALCIUM-MAGNESIUM-ZINC PO Take 1 tablet by mouth at bedtime.   cyclobenzaprine 5 MG tablet Commonly known as:  FLEXERIL Take 5 mg by mouth at bedtime as needed for muscle spasms (back pain).   levothyroxine 75 MCG tablet Commonly known as: SYNTHROID Take 75 mcg by mouth every morning.   lisinopril 5 MG tablet Commonly known as:  ZESTRIL Take 1 tablet (5 mg total) by mouth daily.   medroxyPROGESTERone 2.5 MG tablet Commonly known as: PROVERA Take 2.5 mg by mouth every morning.   nitroGLYCERIN 0.4 MG SL tablet Commonly known as: NITROSTAT Place 1 tablet (0.4 mg total) under the tongue every 5 (five) minutes x 3 doses as needed for chest pain.   POTASSIUM PO Take 1 tablet by mouth every morning.   SYMBICORT IN Inhale 2 puffs into the lungs daily as needed (shortness of breath).   ticagrelor 90 MG Tabs tablet Commonly known as: BRILINTA Take 1 tablet (90 mg total) by mouth 2 (two) times daily.   tiotropium 18 MCG inhalation capsule Commonly known as: SPIRIVA Place 18 mcg into inhaler and inhale daily as needed (shortness of breath).   VITAMIN B-12 PO Take 1 tablet by mouth every morning.   VITAMIN C PO Take 1 tablet by mouth every morning.   VITAMIN D3 PO Take 1 tablet by mouth every morning.       Outstanding Labs/Studies   FLP/LFTs in 8 weeks  Duration of Discharge Encounter   Greater than 30 minutes including physician time.  Signed, Reino Bellis, NP 08/15/2020, 11:17 AM

## 2020-08-15 NOTE — Progress Notes (Signed)
CARDIAC REHAB PHASE I   PRE:  Rate/Rhythm: 85 SR    BP: sitting 119/65    SaO2: 100 RA  MODE:  Ambulation: 370 ft   POST:  Rate/Rhythm: 100 ST    BP: sitting 126/94     SaO2: 99 RA  Tolerated well except c/o back/leg pain with distance. Has been ongoing issue PTA, seeing specialist. Discussed MI, stent, restrictions, Brilinta, diet, exercise as tolerated, NTG and CRPII. Pt voiced understanding. Highly encouraged CRPII since long distance walking is difficult for her back. Will refer to [place], ACSM 08/15/2020 9:38 AM

## 2020-08-15 NOTE — TOC Benefit Eligibility Note (Signed)
Transition of Care [facility]) Benefit Eligibility Note    Patient Details  Name: Kristi Burns MRN: [id number] Date of Birth: 1946-04-14   Medication/Dose: BRILINTA  90 MG BID  Covered?: Yes  Tier: 3 Drug  Prescription Coverage [facility]erred [facility]  [place] with Person/Company/Phone Number:: KAYE  @ [employer] [place] #  [phone]  Co-Pay: $ 47.00  Prior Approval: No  Deductible:  (NO DEDUCTIBLE WITH PLAN  and OUT-OF-POCKET:UNMET)  Additional Notes: TICAGRELOR : [name] Phone Number: 08/15/2020, 10:31 AM

## 2020-08-17 MED FILL — Heparin Sod (Porcine)-NaCl IV Soln 1000 Unit/500ML-0.9%: INTRAVENOUS | Qty: 1000 | Status: AC

## 2020-08-18 ENCOUNTER — Telehealth (HOSPITAL_COMMUNITY): Payer: Self-pay

## 2020-08-18 NOTE — Telephone Encounter (Signed)
Per phase I, fax cardiac rehab referral to [facility].  

## 2020-08-22 DIAGNOSIS — N289 Disorder of kidney and ureter, unspecified: Secondary | ICD-10-CM | POA: Diagnosis not present

## 2020-08-22 DIAGNOSIS — R0600 Dyspnea, unspecified: Secondary | ICD-10-CM | POA: Diagnosis not present

## 2020-08-22 DIAGNOSIS — I252 Old myocardial infarction: Secondary | ICD-10-CM | POA: Diagnosis not present

## 2020-08-22 DIAGNOSIS — Z09 Encounter for follow-up examination after completed treatment for conditions other than malignant neoplasm: Secondary | ICD-10-CM | POA: Diagnosis not present

## 2020-08-22 DIAGNOSIS — Z79899 Other long term (current) drug therapy: Secondary | ICD-10-CM | POA: Diagnosis not present

## 2020-08-25 DIAGNOSIS — N289 Disorder of kidney and ureter, unspecified: Secondary | ICD-10-CM | POA: Diagnosis not present

## 2020-08-25 DIAGNOSIS — N281 Cyst of kidney, acquired: Secondary | ICD-10-CM | POA: Diagnosis not present

## 2020-08-25 DIAGNOSIS — K838 Other specified diseases of biliary tract: Secondary | ICD-10-CM | POA: Diagnosis not present

## 2020-08-25 DIAGNOSIS — Z9049 Acquired absence of other specified parts of digestive tract: Secondary | ICD-10-CM | POA: Diagnosis not present

## 2020-08-29 ENCOUNTER — Ambulatory Visit: Payer: Medicare Other | Admitting: Cardiology

## 2020-08-30 DIAGNOSIS — N289 Disorder of kidney and ureter, unspecified: Secondary | ICD-10-CM | POA: Diagnosis not present

## 2020-09-08 ENCOUNTER — Encounter: Payer: Self-pay | Admitting: Cardiology

## 2020-09-08 ENCOUNTER — Ambulatory Visit (INDEPENDENT_AMBULATORY_CARE_PROVIDER_SITE_OTHER): Payer: Medicare Other

## 2020-09-08 ENCOUNTER — Other Ambulatory Visit: Payer: Self-pay

## 2020-09-08 ENCOUNTER — Ambulatory Visit: Payer: Medicare Other | Admitting: Cardiology

## 2020-09-08 VITALS — BP 132/64 | HR 105 | Ht 63.0 in | Wt 139.2 lb

## 2020-09-08 DIAGNOSIS — R001 Bradycardia, unspecified: Secondary | ICD-10-CM

## 2020-09-08 DIAGNOSIS — I2510 Atherosclerotic heart disease of native coronary artery without angina pectoris: Secondary | ICD-10-CM

## 2020-09-08 DIAGNOSIS — E782 Mixed hyperlipidemia: Secondary | ICD-10-CM | POA: Diagnosis not present

## 2020-09-08 DIAGNOSIS — I10 Essential (primary) hypertension: Secondary | ICD-10-CM | POA: Diagnosis not present

## 2020-09-08 HISTORY — DX: Atherosclerotic heart disease of native coronary artery without angina pectoris: I25.10

## 2020-09-08 LAB — BASIC METABOLIC PANEL
BUN/Creatinine Ratio: 17 (ref 12–28)
BUN: 14 mg/dL (ref 8–27)
CO2: 20 mmol/L (ref 20–29)
Calcium: 9.6 mg/dL (ref 8.7–10.3)
Chloride: 103 mmol/L (ref 96–106)
Creatinine, Ser: 0.81 mg/dL (ref 0.57–1.00)
Glucose: 105 mg/dL — ABNORMAL HIGH (ref 65–99)
Potassium: 3.9 mmol/L (ref 3.5–5.2)
Sodium: 138 mmol/L (ref 134–144)
eGFR: 77 mL/min/{1.73_m2} (ref >59)

## 2020-09-08 NOTE — Patient Instructions (Signed)
Medication Instructions:  Your physician recommends that you continue on your current medications as directed. Please refer to the Current Medication list given to you today.  *If you need a refill on your cardiac medications before your next appointment, please call your pharmacy*   Lab Work: Your physician recommends that you return for lab work today: bmp   If you have labs (blood work) drawn today and your tests are completely normal, you will receive your results only by: Marland Kitchen MyChart Message (if you have MyChart) OR . A paper copy in the mail If you have any lab test that is abnormal or we need to change your treatment, we will call you to review the results.   Testing/Procedures: A zio monitor was ordered today. It will remain on for 7 days. You will then return monitor and event diary in provided box. It takes 1-2 weeks for report to be downloaded and returned to [place]. We will call you with the results. If monitor falls off or has orange flashing light, please call Zio for further instructions.      Follow-Up: At [facility], you and your health needs are our priority.  As part of our continuing mission to provide you with exceptional heart care, we have created designated Provider Care Teams.  These Care Teams include your primary Cardiologist (physician) and Advanced Practice Providers (APPs -  Physician Assistants and Nurse Practitioners) who all work together to provide you with the care you need, when you need it.  We recommend signing up for the patient portal called "MyChart".  Sign up information is provided on this After Visit Summary.  MyChart is used to connect with patients for Virtual Visits (Telemedicine).  Patients are able to view lab/test results, encounter notes, upcoming appointments, etc.  Non-urgent messages can be sent to your provider as well.   To learn more about what you can do with MyChart, go to [email or web address].    Your next appointment:   1  month(s)  The format for your next appointment:   In Person  Provider:   Jenne Campus, MD   Other Instructions  We will refer you to cardiac rehab at [place].

## 2020-09-08 NOTE — Progress Notes (Signed)
Cardiology Office Note:    Date:  09/08/2020   ID:  Kristi Burns, DOB [birth year], MRN [id number]  PCP:  Raina Mina., MD  Cardiologist:  Jenne Campus, MD    Referring MD: Raina Mina., MD   No chief complaint on file. I am doing well  History of Present Illness:    Kristi Burns is a 75 y.o. female with past medical history significant for coronary artery disease, 4 weeks ago she ended up coming to around the hospital because of some sweatiness as well as chest pain.  She was find to have non-STEMI with a highest troponin I of 1.8, she eventually ended up being transferred to [facility] when she had cardiac catheterization done, she was find to have 95% mid LAD stenosis which was addressed with drug-eluting stent.  There was also a 25% stenosis distally from the lesion that was stented.  She did have mild anterior wall hypokinesis with ejection fraction 50 to 55%.  Her past medical history is also significant for dyslipidemia as well as history of smoking but she quit years ago.  Also hypertension. Comes today 2 months for follow-up feeling well denies have any chest pain tightness squeezing pressure burning chest.  She does have a chronic back problem therefore regular exercises by walking is difficult for her.  Past Medical History:  Diagnosis Date  . AKI (acute kidney injury) ([place]) 08/13/2020  . Anxiety disorder 11/15/2015  . Chest pain 08/13/2020  . COPD (chronic obstructive pulmonary disease) ([place]) 08/13/2020  . Encounter for long-term current use of high risk medication 11/15/2015  . Essential hypertension 11/15/2015  . 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 Hypothyroidism (acquired) 11/15/2015  . Malaise and fatigue 11/15/2015  . NSTEMI (non-ST elevated myocardial infarction) ([place]) 08/13/2020  . Peripheral vascular disease ([place]) 11/15/2015    Past Surgical History:  Procedure Laterality Date  . CORONARY STENT INTERVENTION N/A 08/14/2020   Procedure: CORONARY  STENT INTERVENTION;  Surgeon: Jettie Booze, MD;  Location: [place] [facility];  Service: Cardiovascular;  Laterality: N/A;  . INTRAVASCULAR ULTRASOUND/IVUS N/A 08/14/2020   Procedure: Intravascular Ultrasound/IVUS;  Surgeon: Jettie Booze, MD;  Location: [place] [facility];  Service: Cardiovascular;  Laterality: N/A;  . LEFT HEART CATH AND CORONARY ANGIOGRAPHY N/A 08/14/2020   Procedure: LEFT HEART CATH AND CORONARY ANGIOGRAPHY;  Surgeon: Jettie Booze, MD;  Location: [place] [facility];  Service: Cardiovascular;  Laterality: N/A;    Current Medications: Current Meds  Medication Sig  . acetaminophen (TYLENOL) 500 MG tablet Take 1,000 mg by mouth 2 (two) times daily as needed (back pain). Take with ibuprofen  . ALPRAZolam (XANAX) 0.5 MG tablet Take 0.5 mg by mouth daily as needed for anxiety.  . Ascorbic Acid (VITAMIN C PO) Take 1 tablet by mouth every morning.  Marland Kitchen aspirin 81 MG chewable tablet Chew 1 tablet (81 mg total) by mouth daily.  Marland Kitchen atorvastatin (LIPITOR) 80 MG tablet Take 1 tablet (80 mg total) by mouth daily.  Marland Kitchen BIOTIN PO Take 1 tablet by mouth every morning.  . Budesonide-Formoterol Fumarate (SYMBICORT IN) Inhale 2 puffs into the lungs daily as needed (shortness of breath).  . Calcium Carbonate-Vitamin D (CALCIUM-D PO) Take 1 tablet by mouth 3 (three) times daily with meals.  Marland Kitchen CALCIUM-MAGNESIUM-ZINC PO Take 1 tablet by mouth at bedtime.  . Cholecalciferol (VITAMIN D3 PO) Take 1 tablet by mouth every morning.  . Cyanocobalamin (VITAMIN B-12 PO) Take 1 tablet by  mouth every morning.  . cyclobenzaprine (FLEXERIL) 5 MG tablet Take 5 mg by mouth at bedtime as needed for muscle spasms (back pain).  Marland Kitchen levothyroxine (SYNTHROID, LEVOTHROID) 75 MCG tablet Take 75 mcg by mouth every morning.  Marland Kitchen lisinopril (ZESTRIL) 5 MG tablet Take 1 tablet (5 mg total) by mouth daily.  . medroxyPROGESTERone (PROVERA) 2.5 MG tablet Take 2.5 mg by mouth every morning.  . nitroGLYCERIN  (NITROSTAT) 0.4 MG SL tablet Place 1 tablet (0.4 mg total) under the tongue every 5 (five) minutes x 3 doses as needed for chest pain.  Marland Kitchen POTASSIUM PO Take 1 tablet by mouth every morning.  . prasugrel (EFFIENT) 10 MG TABS tablet Take 10 mg by mouth daily.     Allergies:   Other   Social History   Socioeconomic History  . Marital status: Widowed    Spouse name: Not on file  . Number of children: Not on file  . Years of education: Not on file  . Highest education level: Not on file  Occupational History  . Not on file  Tobacco Use  . Smoking status: Never Smoker  . Smokeless tobacco: Never Used  Substance and Sexual Activity  . Alcohol use: No  . Drug use: No  . Sexual activity: Not on file  Other Topics Concern  . Not on file  Social History Narrative  . Not on file   Social Determinants of Health   Financial Resource Strain: Not on file  Food Insecurity: Not on file  Transportation Needs: Not on file  Physical Activity: Not on file  Stress: Not on file  Social Connections: Not on file     Family History: The patient's family history is not on file. ROS:   Please see the history of present illness.    All 14 point review of systems negative except as described per history of present illness  EKGs/Labs/Other Studies Reviewed:      Recent Labs: 08/13/2020: ALT 7; Magnesium 1.7; TSH 7.050 08/15/2020: BUN 12; Creatinine, Ser 0.96; Hemoglobin 11.6; Platelets 204; Potassium 3.6; Sodium 139  Recent Lipid Panel    Component Value Date/Time   CHOL 129 08/13/2020 1831   TRIG 78 08/13/2020 1831   HDL 43 08/13/2020 1831   CHOLHDL 3.0 08/13/2020 1831   VLDL 16 08/13/2020 1831   LDLCALC 70 08/13/2020 1831    Physical Exam:    VS:  BP 132/64   Pulse (!) 105   Ht 5\' 3"  (1.6 m)   Wt 139 lb 3.2 oz (63.1 kg)   SpO2 97%   BMI 24.66 kg/m     Wt Readings from Last 3 Encounters:  09/08/20 139 lb 3.2 oz (63.1 kg)  08/15/20 149 lb 4 oz (67.7 kg)     GEN:  Well  nourished, well developed in no acute distress HEENT: Normal NECK: No JVD; No carotid bruits LYMPHATICS: No lymphadenopathy CARDIAC: RRR, no murmurs, no rubs, no gallops RESPIRATORY:  Clear to auscultation without rales, wheezing or rhonchi  ABDOMEN: Soft, non-tender, non-distended MUSCULOSKELETAL:  No edema; No deformity  SKIN: Warm and dry LOWER EXTREMITIES: no swelling NEUROLOGIC:  Alert and oriented x 3 PSYCHIATRIC:  Normal affect   ASSESSMENT:    1. Bradycardia   2. Coronary artery disease involving native coronary artery of native heart without angina pectoris   3. Mixed hyperlipidemia   4. Essential hypertension    PLAN:    In order of problems listed above:  1. Coronary artery disease status post  PTCA and stenting of mid LAD in face of acute non-STEMI.  Doing well overall but she did have problem with Brilinta and her primary care physician switch her from Brilinta to Effient 10 mg daily.  We will continue dual antiplatelet therapy for at least 6 months of favorably 12 months in her situation since stent was implanted in face of acute coronary syndrome.  While in the hospital she was also put on beta-blocker however developed significant pauses up to 5.5 seconds at that medication has been withdrawn.  Patient tells me also while she was in the hospital and nauseous, she was giving higher dosages of Xanax and she does not remember three quarters of hospitalization.  I am worried that maybe bradycardia that she got on beta-blocker was related to some Xanax as well.  I will plan to put monitor her for week to see if she is have any significant bradycardia if none identified then I will try small dose of beta-blocker.  She is already on ACE inhibitor small dose, I will check a Chem-7 today if Chem-7 acceptable will increase the dose of ACE inhibitor. 2. Mixed dyslipidemia I did review her K PN which show me her LDL of 70.  She is on high intensity statin form of Lipitor 80 which I will  continue. 3. When should be here we will recheck her fasting lipid profile. 4. Essential hypertension, blood pressure seems to well controlled continue present medication.  Anticipate need to increase dose of ACE inhibitor which should help with the blood pressure as well. 5. We spent a great deal of time talking about her event I did explain to her she had a heart attack however damage seems to be very minimal.  I explained to her what we find on cardiac catheterization I will fix it I stressed importance of taking all medications especially continuation of dual antiplatelet therapy.  We did talk about healthy lifestyle good diet as well as need to exercise.  She accepted my offer to be referred to cardiac rehab which we will do.  I did review record from round of hospital as well as from [facility] for this visit   Medication Adjustments/Labs and Tests Ordered: Current medicines are reviewed at length with the patient today.  Concerns regarding medicines are outlined above.  Orders Placed This Encounter  Procedures  . Basic metabolic panel  . LONG TERM MONITOR (3-14 DAYS)  . EKG 12-Lead   Medication changes: No orders of the defined types were placed in this encounter.   Signed, Park Liter, MD, [facility] 09/08/2020 10:45 AM    [place]

## 2020-09-12 ENCOUNTER — Telehealth: Payer: Self-pay

## 2020-09-12 DIAGNOSIS — I10 Essential (primary) hypertension: Secondary | ICD-10-CM

## 2020-09-12 MED ORDER — LISINOPRIL 10 MG PO TABS: 10.0000 mg | ORAL_TABLET | Freq: Every day | ORAL | 3 refills | Status: DC

## 2020-09-12 NOTE — Telephone Encounter (Signed)
-----   Message from Park Liter, MD sent at 09/12/2020  8:22 AM EDT ----- Chem-7 looks good, increased dose of lisinopril from 5 mg to 10 mg daily, Chem-7 need to be repeated in about 1 week

## 2020-09-12 NOTE — Progress Notes (Signed)
Patient notified of results ana agrees with plan. Patient will stop by in one week for labs. Order completed

## 2020-09-12 NOTE — Telephone Encounter (Signed)
Patient notified of results ana agrees with plan. Patient will stop by in one week for labs. Order completed

## 2020-09-15 ENCOUNTER — Telehealth: Payer: Self-pay | Admitting: Cardiology

## 2020-09-15 DIAGNOSIS — R001 Bradycardia, unspecified: Secondary | ICD-10-CM

## 2020-09-15 NOTE — Telephone Encounter (Signed)
Patient notified of the following and chooses to d/c lisinopril. She will self monitor her sx's and if she feel she needs to resume, she will go back to 1 a day. I encourage the patient message [place] if her sx's return.

## 2020-09-15 NOTE — Telephone Encounter (Signed)
I apologize. I will do that.

## 2020-09-15 NOTE — Telephone Encounter (Signed)
Pt c/o medication issue:  1. Name of Medication: lisinopril (ZESTRIL) 10 MG tablet  2. How are you currently taking this medication (dosage and times per day)? 2 a day (Patient stated she was told earlier in the week to start taking 2 instead of 1)  3. Are you having a reaction (difficulty breathing--STAT)?no  4. What is your medication issue? Patient said since she been taken 2 a day she has had stiff neck and unable to hardly move. Patient is wondering if she needs to go back to taken 1 instead of two   Patient also states that she goes to a primary and on the 10 and blood will be taken then, patients wants to know will that be sufficient enough for [place]. Or does she have to come in to [place] to get done again

## 2020-09-15 NOTE — Telephone Encounter (Signed)
Message sent to Dr. Agustin Cree for further instructions

## 2020-09-15 NOTE — Telephone Encounter (Signed)
This is Dr. Wendy Poet patient, please forward to him.

## 2020-09-15 NOTE — Telephone Encounter (Signed)
That would be very unusual side effect of this medication.  But for completion we can simply stop or go back to 1 tablet daily and see if she feels any better

## 2020-09-19 DIAGNOSIS — R001 Bradycardia, unspecified: Secondary | ICD-10-CM | POA: Diagnosis not present

## 2020-09-25 ENCOUNTER — Telehealth: Payer: Self-pay | Admitting: Emergency Medicine

## 2020-09-25 DIAGNOSIS — I252 Old myocardial infarction: Secondary | ICD-10-CM | POA: Diagnosis not present

## 2020-09-25 DIAGNOSIS — N289 Disorder of kidney and ureter, unspecified: Secondary | ICD-10-CM | POA: Diagnosis not present

## 2020-09-25 MED ORDER — METOPROLOL SUCCINATE ER 25 MG PO TB24: 25.0000 mg | ORAL_TABLET | Freq: Every day | ORAL | 1 refills | Status: DC

## 2020-09-25 NOTE — Telephone Encounter (Signed)
-----   Message from Park Liter, MD sent at 09/21/2020 10:23 PM EDT ----- Episode of ventricular tachycardia noted please start metoprolol succinate but only 25 mg daily

## 2020-09-26 DIAGNOSIS — N289 Disorder of kidney and ureter, unspecified: Secondary | ICD-10-CM | POA: Diagnosis not present

## 2020-10-05 ENCOUNTER — Other Ambulatory Visit: Payer: Self-pay

## 2020-10-09 ENCOUNTER — Encounter: Payer: Self-pay | Admitting: Cardiology

## 2020-10-09 ENCOUNTER — Ambulatory Visit: Payer: Medicare Other | Admitting: Cardiology

## 2020-10-09 ENCOUNTER — Other Ambulatory Visit: Payer: Self-pay

## 2020-10-09 VITALS — BP 100/52 | HR 60 | Ht 62.0 in | Wt 136.0 lb

## 2020-10-09 DIAGNOSIS — I472 Ventricular tachycardia, unspecified: Secondary | ICD-10-CM | POA: Insufficient documentation

## 2020-10-09 DIAGNOSIS — E782 Mixed hyperlipidemia: Secondary | ICD-10-CM | POA: Diagnosis not present

## 2020-10-09 DIAGNOSIS — I2510 Atherosclerotic heart disease of native coronary artery without angina pectoris: Secondary | ICD-10-CM

## 2020-10-09 DIAGNOSIS — I214 Non-ST elevation (NSTEMI) myocardial infarction: Secondary | ICD-10-CM | POA: Diagnosis not present

## 2020-10-09 NOTE — Patient Instructions (Signed)
Medication Instructions:  Your physician recommends that you continue on your current medications as directed. Please refer to the Current Medication list given to you today.  *If you need a refill on your cardiac medications before your next appointment, please call your pharmacy*   Lab Work: Your physician recommends that you return for lab work today: lipid, lft   If you have labs (blood work) drawn today and your tests are completely normal, you will receive your results only by: Marland Kitchen MyChart Message (if you have MyChart) OR . A paper copy in the mail If you have any lab test that is abnormal or we need to change your treatment, we will call you to review the results.   Testing/Procedures: None   Follow-Up: At [facility], you and your health needs are our priority.  As part of our continuing mission to provide you with exceptional heart care, we have created designated Provider Care Teams.  These Care Teams include your primary Cardiologist (physician) and Advanced Practice Providers (APPs -  Physician Assistants and Nurse Practitioners) who all work together to provide you with the care you need, when you need it.  We recommend signing up for the patient portal called "MyChart".  Sign up information is provided on this After Visit Summary.  MyChart is used to connect with patients for Virtual Visits (Telemedicine).  Patients are able to view lab/test results, encounter notes, upcoming appointments, etc.  Non-urgent messages can be sent to your provider as well.   To learn more about what you can do with MyChart, go to [email or web address].    Your next appointment:   3 month(s)  The format for your next appointment:   In Person  Provider:   Jenne Campus, MD   Other Instructions

## 2020-10-09 NOTE — Progress Notes (Signed)
Cardiology Office Note:    Date:  10/09/2020   ID:  Kristi Burns, DOB 02/06/1946, MRN [id number]  PCP:  Earlyne Iba, NP  Cardiologist:  Jenne Campus, MD    Referring MD: Raina Mina., MD   Chief Complaint  Patient presents with  . Follow-up  I am doing fine  History of Present Illness:    Kristi Burns is a 75 y.o. female   with past medical history significant for coronary artery disease, 4 weeks ago she ended up coming to around the hospital because of some sweatiness as well as chest pain.  She was find to have non-STEMI with a highest troponin I of 1.8, she eventually ended up being transferred to [facility] when she had cardiac catheterization done, she was find to have 95% mid LAD stenosis which was addressed with drug-eluting stent.  There was also a 25% stenosis distally from the lesion that was stented.  She did have mild anterior wall hypokinesis with ejection fraction 50 to 55%.  Her past medical history is also significant for dyslipidemia as well as history of smoking but she quit years ago.  Also hypertension. I did put monitor on her with intention to check for significant bradycardia as well as AV block.  Surprisingly showed 2 episode of nonsustained ventricular tachycardia.  I will convert her meniscus dose of metoprolol succinate to only 25 mg daily she seems to be tolerating this well denies having dizziness passing out.  No nightmares. Denies have any chest pain tightness squeezing pressure burning chest overall seems to be tolerating medications well.  Past Medical History:  Diagnosis Date  . AKI (acute kidney injury) ([place]) 08/13/2020  . Anxiety disorder 11/15/2015  . Chest pain 08/13/2020  . COPD (chronic obstructive pulmonary disease) ([place]) 08/13/2020  . Coronary artery disease status post non-STEMI, PTCA and stent to mid LAD in March 2022 09/08/2020  . Encounter for long-term current use of high risk medication 11/15/2015  . Essential hypertension  11/15/2015  . 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 Hypothyroidism (acquired) 11/15/2015  . Malaise and fatigue 11/15/2015  . Mixed hyperlipidemia 11/15/2015  . NSTEMI (non-ST elevated myocardial infarction) ([place]) 08/13/2020  . Peripheral vascular disease ([place]) 11/15/2015    Past Surgical History:  Procedure Laterality Date  . CORONARY STENT INTERVENTION N/A 08/14/2020   Procedure: CORONARY STENT INTERVENTION;  Surgeon: Jettie Booze, MD;  Location: [place] [facility];  Service: Cardiovascular;  Laterality: N/A;  . INTRAVASCULAR ULTRASOUND/IVUS N/A 08/14/2020   Procedure: Intravascular Ultrasound/IVUS;  Surgeon: Jettie Booze, MD;  Location: [place] [facility];  Service: Cardiovascular;  Laterality: N/A;  . LEFT HEART CATH AND CORONARY ANGIOGRAPHY N/A 08/14/2020   Procedure: LEFT HEART CATH AND CORONARY ANGIOGRAPHY;  Surgeon: Jettie Booze, MD;  Location: [place] [facility];  Service: Cardiovascular;  Laterality: N/A;    Current Medications: Current Meds  Medication Sig  . acetaminophen (TYLENOL) 500 MG tablet Take 1,000 mg by mouth 2 (two) times daily as needed for mild pain or moderate pain (back pain). Take with ibuprofen  . ALPRAZolam (XANAX) 0.5 MG tablet Take 0.5 mg by mouth daily as needed for anxiety.  . Ascorbic Acid (VITAMIN C PO) Take 1 tablet by mouth every morning.  Marland Kitchen aspirin 81 MG chewable tablet Chew 1 tablet (81 mg total) by mouth daily.  Marland Kitchen atorvastatin (LIPITOR) 80 MG tablet TAKE 1 TABLET (80 MG TOTAL) BY MOUTH DAILY. (Patient taking differently: Take 80 mg  by mouth daily.)  . BIOTIN PO Take 1 tablet by mouth every morning. Unknown strength  . Budesonide-Formoterol Fumarate (SYMBICORT IN) Inhale 2 puffs into the lungs daily as needed (shortness of breath).  . Calcium Carbonate-Vitamin D (CALCIUM-D PO) Take 1 tablet by mouth 3 (three) times daily with meals.  Marland Kitchen CALCIUM-MAGNESIUM-ZINC PO Take 1 tablet by mouth at bedtime. Unknown strength  .  Cholecalciferol (VITAMIN D3 PO) Take 1 tablet by mouth every morning. Unknown strength  . Cyanocobalamin (VITAMIN B-12 PO) Take 1 tablet by mouth every morning. Unknown strength  . cyclobenzaprine (FLEXERIL) 5 MG tablet Take 5 mg by mouth at bedtime as needed for muscle spasms (back pain).  . hydrochlorothiazide (HYDRODIURIL) 25 MG tablet Take 1 tablet by mouth daily.  Marland Kitchen levothyroxine (SYNTHROID) 88 MCG tablet Take 88 mcg by mouth daily.  Marland Kitchen lisinopril (ZESTRIL) 10 MG tablet Take 1 tablet (10 mg total) by mouth daily.  . medroxyPROGESTERone (PROVERA) 2.5 MG tablet Take 2.5 mg by mouth every morning.  . metoprolol succinate (TOPROL-XL) 25 MG 24 hr tablet Take 1 tablet (25 mg total) by mouth daily. Take with or immediately following a meal.  . nitroGLYCERIN (NITROSTAT) 0.4 MG SL tablet PLACE 1 TABLET (0.4 MG TOTAL) UNDER THE TONGUE EVERY 5 (FIVE) MINUTES X 3 DOSES AS NEEDED FOR CHEST PAIN. (Patient taking differently: Place 0.4 mg under the tongue every 5 (five) minutes x 3 doses as needed for chest pain.)  . POTASSIUM PO Take 1 tablet by mouth every morning. Unknown strenght  . prasugrel (EFFIENT) 10 MG TABS tablet Take 10 mg by mouth daily.  . ticagrelor (BRILINTA) 90 MG TABS tablet TAKE 1 TABLET (90 MG TOTAL) BY MOUTH TWO TIMES DAILY. (Patient taking differently: Take 90 mg by mouth 2 (two) times daily.)     Allergies:   Other   Social History   Socioeconomic History  . Marital status: Widowed    Spouse name: Not on file  . Number of children: Not on file  . Years of education: Not on file  . Highest education level: Not on file  Occupational History  . Not on file  Tobacco Use  . Smoking status: Never Smoker  . Smokeless tobacco: Never Used  Substance and Sexual Activity  . Alcohol use: No  . Drug use: No  . Sexual activity: Not on file  Other Topics Concern  . Not on file  Social History Narrative  . Not on file   Social Determinants of Health   Financial Resource Strain:  Not on file  Food Insecurity: Not on file  Transportation Needs: Not on file  Physical Activity: Not on file  Stress: Not on file  Social Connections: Not on file     Family History: The patient's family history is not on file. ROS:   Please see the history of present illness.    All 14 point review of systems negative except as described per history of present illness  EKGs/Labs/Other Studies Reviewed:      Recent Labs: 08/13/2020: ALT 7; Magnesium 1.7; TSH 7.050 08/15/2020: Hemoglobin 11.6; Platelets 204 09/08/2020: BUN 14; Creatinine, Ser 0.81; Potassium 3.9; Sodium 138  Recent Lipid Panel    Component Value Date/Time   CHOL 129 08/13/2020 1831   TRIG 78 08/13/2020 1831   HDL 43 08/13/2020 1831   CHOLHDL 3.0 08/13/2020 1831   VLDL 16 08/13/2020 1831   LDLCALC 70 08/13/2020 1831    Physical Exam:    VS:  BP Marland Kitchen)  100/52 (BP Location: Left Arm, Patient Position: Sitting)   Pulse 60   Ht 5\' 2"  (1.575 m)   Wt 136 lb (61.7 kg)   SpO2 97%   BMI 24.87 kg/m     Wt Readings from Last 3 Encounters:  10/09/20 136 lb (61.7 kg)  09/08/20 139 lb 3.2 oz (63.1 kg)  08/15/20 149 lb 4 oz (67.7 kg)     GEN:  Well nourished, well developed in no acute distress HEENT: Normal NECK: No JVD; No carotid bruits LYMPHATICS: No lymphadenopathy CARDIAC: RRR, no murmurs, no rubs, no gallops RESPIRATORY:  Clear to auscultation without rales, wheezing or rhonchi  ABDOMEN: Soft, non-tender, non-distended MUSCULOSKELETAL:  No edema; No deformity  SKIN: Warm and dry LOWER EXTREMITIES: no swelling NEUROLOGIC:  Alert and oriented x 3 PSYCHIATRIC:  Normal affect   ASSESSMENT:    1. Mixed hyperlipidemia   2. NSTEMI (non-ST elevated myocardial infarction) ([place])   3. Coronary artery disease involving native coronary artery of native heart without angina pectoris   4. Ventricular tachycardia ([place])    PLAN:    In order of problems listed above:  1. Mixed dyslipidemia she is on a high  intense statin which I will continue we will recheck her fasting lipid profile. 2. Coronary disease status post PTCA and stenting doing well tolerating dual antiplatelet therapy quite well. 3. Ventricular tachycardia noted on the monitor.  She will be referred to our EP team for evaluation. 4. We did talk about healthy lifestyle need to exercise on the regular basis she does have prime with the left hip with slow her down but otherwise still quite active.   Medication Adjustments/Labs and Tests Ordered: Current medicines are reviewed at length with the patient today.  Concerns regarding medicines are outlined above.  Orders Placed This Encounter  Procedures  . Lipid panel  . Hepatic function panel  . Ambulatory referral to Cardiac Electrophysiology   Medication changes: No orders of the defined types were placed in this encounter.   Signed, Park Liter, MD, [facility] 10/09/2020 11:50 AM    [place]

## 2020-10-10 LAB — LIPID PANEL
Chol/HDL Ratio: 2.3 ratio (ref 0.0–4.4)
Cholesterol, Total: 89 mg/dL — ABNORMAL LOW (ref 100–199)
HDL: 38 mg/dL — ABNORMAL LOW (ref >39)
LDL Chol Calc (NIH): 35 mg/dL (ref 0–99)
Triglycerides: 75 mg/dL (ref 0–149)
VLDL Cholesterol Cal: 16 mg/dL (ref 5–40)

## 2020-10-10 LAB — HEPATIC FUNCTION PANEL
ALT: 5 IU/L (ref 0–32)
AST: 10 IU/L (ref 0–40)
Albumin: 4.1 g/dL (ref 3.7–4.7)
Alkaline Phosphatase: 83 IU/L (ref 44–121)
Bilirubin Total: 0.4 mg/dL (ref 0.0–1.2)
Bilirubin, Direct: 0.15 mg/dL (ref 0.00–0.40)
Total Protein: 6.6 g/dL (ref 6.0–8.5)

## 2020-11-02 DIAGNOSIS — N289 Disorder of kidney and ureter, unspecified: Secondary | ICD-10-CM | POA: Diagnosis not present

## 2020-11-02 DIAGNOSIS — N281 Cyst of kidney, acquired: Secondary | ICD-10-CM | POA: Diagnosis not present

## 2020-11-08 ENCOUNTER — Telehealth: Payer: Self-pay

## 2020-11-08 DIAGNOSIS — E039 Hypothyroidism, unspecified: Secondary | ICD-10-CM | POA: Diagnosis not present

## 2020-11-08 NOTE — Telephone Encounter (Signed)
Needing refill [facility] into [facility] for Heorvastatin 80mg , Lisinopril 10mg , Fefient 10mg , Metoprolol Succinanter 25 mg. Also wanting to know if it's okay if she takes Clartin for her allergies. CB # [id number]

## 2020-11-09 MED ORDER — ATORVASTATIN CALCIUM 80 MG PO TABS: 80.0000 mg | ORAL_TABLET | Freq: Every day | ORAL | 1 refills | Status: DC

## 2020-11-09 MED ORDER — PRASUGREL HCL 10 MG PO TABS: 10.0000 mg | ORAL_TABLET | Freq: Every day | ORAL | 1 refills | Status: DC

## 2020-11-09 MED ORDER — METOPROLOL SUCCINATE ER 25 MG PO TB24: 25.0000 mg | ORAL_TABLET | Freq: Every day | ORAL | 1 refills | Status: DC

## 2020-11-09 MED ORDER — LISINOPRIL 10 MG PO TABS: 10.0000 mg | ORAL_TABLET | Freq: Every day | ORAL | 1 refills | Status: DC

## 2020-11-09 NOTE — Telephone Encounter (Signed)
Called patient. Clarified she is not taking brilinta. Took off her chart. Refilled her medications.

## 2020-11-10 NOTE — Telephone Encounter (Signed)
Called patient informed her she can take clartin per Dr. Agustin Cree.

## 2020-11-21 ENCOUNTER — Other Ambulatory Visit: Payer: Self-pay

## 2020-11-21 ENCOUNTER — Encounter: Payer: Self-pay | Admitting: Sports Medicine

## 2020-11-21 ENCOUNTER — Ambulatory Visit: Payer: Medicare Other | Admitting: Sports Medicine

## 2020-11-21 DIAGNOSIS — L603 Nail dystrophy: Secondary | ICD-10-CM | POA: Diagnosis not present

## 2020-11-21 DIAGNOSIS — M79676 Pain in unspecified toe(s): Secondary | ICD-10-CM | POA: Diagnosis not present

## 2020-11-21 MED ORDER — NEOMYCIN-POLYMYXIN-HC 3.5-10000-1 OT SOLN: OTIC | 0 refills | Status: DC

## 2020-11-21 NOTE — Addendum Note (Signed)
Addended by: Landis Martins T on: 11/21/2020 12:24 PM   Modules accepted: Level of Service

## 2020-11-21 NOTE — Progress Notes (Signed)
Subjective: Kristi Burns is a 75 y.o. female patient seen today in office with complaint of mildly painful nails especially at the right first and second toes and the left second toe and states that when there is pressure to her toes when she is in shoes her toes get sore.  States that she has not been unable to trim her toenails due to back problems.  Reports that her daughter usually symptoms that has been unable to.  Denies any other pedal complaints at this time.  Patient Active Problem List   Diagnosis Date Noted  . Ventricular tachycardia ([place]) 10/09/2020  . Coronary artery disease status post non-STEMI, PTCA and stent to mid LAD in March 2022 09/08/2020  . NSTEMI (non-ST elevated myocardial infarction) ([place]) 08/13/2020  . Chest pain 08/13/2020  . COPD (chronic obstructive pulmonary disease) ([place]) 08/13/2020  . AKI (acute kidney injury) ([place]) 08/13/2020  . 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 Essential hypertension 11/15/2015  . Mixed hyperlipidemia 11/15/2015  . Peripheral vascular disease ([place]) 11/15/2015  . Anxiety disorder 11/15/2015  . Encounter for long-term current use of high risk medication 11/15/2015  . Hypothyroidism (acquired) 11/15/2015  . Malaise and fatigue 11/15/2015    Current Outpatient Medications on File Prior to Visit  Medication Sig Dispense Refill  . acetaminophen (TYLENOL) 500 MG tablet Take 1,000 mg by mouth 2 (two) times daily as needed for mild pain or moderate pain (back pain). Take with ibuprofen    . ALPRAZolam (XANAX) 0.5 MG tablet Take 0.5 mg by mouth daily as needed for anxiety.    . Ascorbic Acid (VITAMIN C PO) Take 1 tablet by mouth every morning.    Marland Kitchen aspirin 81 MG chewable tablet Chew 1 tablet (81 mg total) by mouth daily. 90 tablet 1  . atorvastatin (LIPITOR) 80 MG tablet Take 1 tablet (80 mg total) by mouth daily. 90 tablet 1  . BIOTIN PO Take 1 tablet by mouth every morning. Unknown strength    . Budesonide-Formoterol Fumarate  (SYMBICORT IN) Inhale 2 puffs into the lungs daily as needed (shortness of breath).    . Calcium Carbonate-Vitamin D (CALCIUM-D PO) Take 1 tablet by mouth 3 (three) times daily with meals.    Marland Kitchen CALCIUM-MAGNESIUM-ZINC PO Take 1 tablet by mouth at bedtime. Unknown strength    . Cholecalciferol (VITAMIN D3 PO) Take 1 tablet by mouth every morning. Unknown strength    . Cyanocobalamin (VITAMIN B-12 PO) Take 1 tablet by mouth every morning. Unknown strength    . cyclobenzaprine (FLEXERIL) 5 MG tablet Take 5 mg by mouth at bedtime as needed for muscle spasms (back pain).    . hydrochlorothiazide (HYDRODIURIL) 25 MG tablet Take 1 tablet by mouth daily.    Marland Kitchen levothyroxine (SYNTHROID) 88 MCG tablet Take 88 mcg by mouth daily.    Marland Kitchen lisinopril (ZESTRIL) 10 MG tablet Take 1 tablet (10 mg total) by mouth daily. 90 tablet 1  . medroxyPROGESTERone (PROVERA) 2.5 MG tablet Take 2.5 mg by mouth every morning.    . metoprolol succinate (TOPROL-XL) 25 MG 24 hr tablet Take 1 tablet (25 mg total) by mouth daily. Take with or immediately following a meal. 90 tablet 1  . nitroGLYCERIN (NITROSTAT) 0.4 MG SL tablet PLACE 1 TABLET (0.4 MG TOTAL) UNDER THE TONGUE EVERY 5 (FIVE) MINUTES X 3 DOSES AS NEEDED FOR CHEST PAIN. (Patient taking differently: Place 0.4 mg under the tongue every 5 (five) minutes x 3 doses as needed for chest pain.) 25  tablet 2  . POTASSIUM PO Take 1 tablet by mouth every morning. Unknown strenght    . prasugrel (EFFIENT) 10 MG TABS tablet Take 1 tablet (10 mg total) by mouth daily. 90 tablet 1   No current facility-administered medications on file prior to visit.    Allergies  Allergen Reactions  . Other Anxiety    Reaction to Antihistamines    Objective: Physical Exam  General: Well developed, nourished, no acute distress, awake, alert and oriented x 3  Vascular: Dorsalis pedis artery 1/4 bilateral, Posterior tibial artery 1/4 bilateral, skin temperature warm to warm proximal to distal  bilateral lower extremities, no varicosities, pedal hair present bilateral.  Neurological: Gross sensation present via light touch bilateral.   Dermatological: Skin is warm, dry, and supple bilateral, Nails 1-5 on the right and 2 through 5 on the left are tender, long, thick, and discolored with mild subungal debris, no acute ingrowing noted however if the left second and right first and second toenails are much more thicker and slightly incurvated, no webspace macerations present bilateral, no open lesions present bilateral, no callus/corns/hyperkeratotic tissue present bilateral. No signs of infection bilateral.  Musculoskeletal: No symptomatic boney deformities noted bilateral. Muscular strength within normal limits without painon range of motion. No pain with calf compression bilateral.  Assessment and Plan:  Problem List Items Addressed This Visit   None   Visit Diagnoses    Pain around toenail    -  Primary   Relevant Medications   neomycin-polymyxin-hydrocortisone (CORTISPORIN) OTIC solution   Onychodystrophy          -Examined patient.  -Discussed treatment options for painful mycotic nails. -Mechanically debrided and reduced mycotic nails with sterile nail nipper and dremel nail file without incident. -Prescribed Corticosporin solution for patient to use as directed -Patient to return in 2.5 months for follow up evaluation or sooner if symptoms worsen.  Landis Martins, DPM

## 2020-11-22 ENCOUNTER — Telehealth: Payer: Self-pay

## 2020-11-22 NOTE — Telephone Encounter (Signed)
Will give to him to sign tomorrow.

## 2020-11-22 NOTE — Telephone Encounter (Signed)
FYI put a "Release of Activity" form in Dr. Marthann Schiller box to fill out. Patient would like to pick it up tomorrow if at all possible. CB# [phone]

## 2020-11-23 NOTE — Telephone Encounter (Signed)
Called patient informed her Dr. Agustin Cree cannot fill out since she has not seen EP for irregular rhythm yet. She understood will put form up front for her to pick back up. She also states there is another form she dropped off 1 week ago I will look for this as well.

## 2020-11-30 DIAGNOSIS — R922 Inconclusive mammogram: Secondary | ICD-10-CM | POA: Diagnosis not present

## 2020-11-30 DIAGNOSIS — R928 Other abnormal and inconclusive findings on diagnostic imaging of breast: Secondary | ICD-10-CM | POA: Diagnosis not present

## 2020-12-25 DIAGNOSIS — I10 Essential (primary) hypertension: Secondary | ICD-10-CM | POA: Diagnosis not present

## 2020-12-25 DIAGNOSIS — Z23 Encounter for immunization: Secondary | ICD-10-CM | POA: Diagnosis not present

## 2020-12-25 DIAGNOSIS — E039 Hypothyroidism, unspecified: Secondary | ICD-10-CM | POA: Diagnosis not present

## 2020-12-25 DIAGNOSIS — E785 Hyperlipidemia, unspecified: Secondary | ICD-10-CM | POA: Diagnosis not present

## 2020-12-25 DIAGNOSIS — Z79899 Other long term (current) drug therapy: Secondary | ICD-10-CM | POA: Diagnosis not present

## 2021-01-01 DIAGNOSIS — D649 Anemia, unspecified: Secondary | ICD-10-CM | POA: Diagnosis not present

## 2021-01-06 NOTE — Progress Notes (Signed)
Electrophysiology Office Note   Date:  01/08/2021   ID:  Kristi Burns, DOB 10/27/1945, MRN [id number]  PCP:  Earlyne Iba, NP  Cardiologist:  Agustin Cree Primary Electrophysiologist:  Constance Haw, MD    Chief Complaint: VT   History of Present Illness: Kristi Burns is a 75 y.o. female who is being seen today for the evaluation of VT at the request of Park Liter, MD. Presenting today for electrophysiology evaluation.  She has a history significant for coronary artery disease, COPD, hyperlipidemia, hypertension.  She initially presented with non-STEMI and was found to have 95% mid LAD stenosis treated with drug-eluting stent.  She was found to have mild anterior wall hypokinesis with an ejection fraction of 50 to 55%.  She wore a cardiac monitor that showed 2 episodes of nonsustained VT.  She was started on metoprolol.  Today, she denies symptoms of palpitations, chest pain, shortness of breath, orthopnea, PND, lower extremity edema, claudication, dizziness, presyncope, syncope, bleeding, or neurologic sequela. The patient is tolerating medications without difficulties.  She is felt well.  She has had no palpitations.  Her main complaint is of back pain.  She had surgery 2 years ago and is continued to have back and neck pain.  She is hoping for a repeat surgery upcoming.  She is unaware of palpitations.   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   Hypothyroidism (acquired) 11/15/2015   Malaise and fatigue 11/15/2015   Mixed hyperlipidemia 11/15/2015   NSTEMI  (non-ST elevated myocardial infarction) ([place]) 08/13/2020   Peripheral vascular disease ([place]) 11/15/2015   Past Surgical History:  Procedure Laterality Date   CORONARY STENT INTERVENTION N/A 08/14/2020   Procedure: CORONARY STENT INTERVENTION;  Surgeon: Jettie Booze, MD;  Location: [facility];  Service: Cardiovascular;  Laterality: N/A;   INTRAVASCULAR ULTRASOUND/IVUS N/A 08/14/2020   Procedure: Intravascular Ultrasound/IVUS;  Surgeon: Jettie Booze, MD;  Location: [facility];  Service: Cardiovascular;  Laterality: N/A;   LEFT HEART CATH AND CORONARY ANGIOGRAPHY N/A 08/14/2020   Procedure: LEFT HEART CATH AND CORONARY ANGIOGRAPHY;  Surgeon: Jettie Booze, MD;  Location: [facility];  Service: Cardiovascular;  Laterality: N/A;     Current Outpatient Medications  Medication Sig Dispense Refill   acetaminophen (TYLENOL) 500 MG tablet Take 1,000 mg by mouth 2 (two) times daily as needed for mild pain or moderate pain (back pain). Take with ibuprofen     ALPRAZolam (XANAX) 0.5 MG tablet Take 0.5 mg by mouth daily as needed for anxiety.     Ascorbic Acid (VITAMIN C PO) Take 1 tablet by mouth every morning.     aspirin 81 MG chewable tablet Chew 1 tablet (81 mg total) by mouth daily. 90 tablet 1   atorvastatin (LIPITOR) 80 MG tablet Take 1 tablet (80 mg total) by mouth daily. 90 tablet 1   BIOTIN PO Take 1 tablet by mouth every morning. Unknown strength     Budesonide-Formoterol Fumarate (SYMBICORT IN) Inhale 2 puffs into the lungs daily as needed (shortness of breath).  Calcium Carbonate-Vitamin D (CALCIUM-D PO) Take 1 tablet by mouth 3 (three) times daily with meals.     CALCIUM-MAGNESIUM-ZINC PO Take 1 tablet by mouth at bedtime. Unknown strength     Cholecalciferol (VITAMIN D3 PO) Take 1 tablet by mouth every morning. Unknown strength     Cyanocobalamin (VITAMIN B-12 PO) Take 1 tablet by mouth every morning. Unknown strength     cyclobenzaprine  (FLEXERIL) 5 MG tablet Take 5 mg by mouth at bedtime as needed for muscle spasms (back pain).     hydrochlorothiazide (HYDRODIURIL) 25 MG tablet Take 1 tablet by mouth daily.     levothyroxine (SYNTHROID) 88 MCG tablet Take 88 mcg by mouth daily.     lisinopril (ZESTRIL) 10 MG tablet Take 1 tablet (10 mg total) by mouth daily. 90 tablet 1   medroxyPROGESTERone (PROVERA) 2.5 MG tablet Take 2.5 mg by mouth every morning.     metoprolol succinate (TOPROL-XL) 25 MG 24 hr tablet Take 1 tablet (25 mg total) by mouth daily. Take with or immediately following a meal. 90 tablet 1   neomycin-polymyxin-hydrocortisone (CORTISPORIN) OTIC solution Apply 1-2 drops to toenails after shower once a day for at least 1 week 10 mL 0   nitroGLYCERIN (NITROSTAT) 0.4 MG SL tablet PLACE 1 TABLET (0.4 MG TOTAL) UNDER THE TONGUE EVERY 5 (FIVE) MINUTES X 3 DOSES AS NEEDED FOR CHEST PAIN. 25 tablet 2   POTASSIUM PO Take 1 tablet by mouth every morning. Unknown strenght     prasugrel (EFFIENT) 10 MG TABS tablet Take 1 tablet (10 mg total) by mouth daily. 90 tablet 1   No current facility-administered medications for this visit.    Allergies:   Other   Social History:  The patient  reports that she has never smoked. She has never used smokeless tobacco. She reports that she does not drink alcohol and does not use drugs.   Family History:  The patient's family history includes Heart attack in her father; Lung cancer in her mother.    ROS:  Please see the history of present illness.   Otherwise, review of systems is positive for none.   All other systems are reviewed and negative.    PHYSICAL EXAM: VS:  BP 98/60   Pulse 65   Ht '5\' 2"'$  (1.575 m)   Wt 143 lb (64.9 kg)   SpO2 96%   BMI 26.16 kg/m  , BMI Body mass index is 26.16 kg/m. GEN: Well nourished, well developed, in no acute distress  HEENT: normal  Neck: no JVD, carotid bruits, or masses Cardiac: RRR; no murmurs, rubs, or gallops,no edema  Respiratory:   clear to auscultation bilaterally, normal work of breathing GI: soft, nontender, nondistended, + BS MS: no deformity or atrophy  Skin: warm and dry Neuro:  Strength and sensation are intact Psych: euthymic mood, full affect  EKG:  EKG is ordered today. Personal review of the ekg ordered shows sinus rhythm  Recent Labs: 08/13/2020: Magnesium 1.7; TSH 7.050 08/15/2020: Hemoglobin 11.6; Platelets 204 09/08/2020: BUN 14; Creatinine, Ser 0.81; Potassium 3.9; Sodium 138 10/09/2020: ALT 5    Lipid Panel     Component Value Date/Time   CHOL 89 (L) 10/09/2020 1147   TRIG 75 10/09/2020 1147   HDL 38 (L) 10/09/2020 1147   CHOLHDL 2.3 10/09/2020 1147   CHOLHDL 3.0 08/13/2020 1831   VLDL 16 08/13/2020 1831   LDLCALC 35 10/09/2020 1147     Wt Readings from Last 3 Encounters:  01/08/21 143  lb (64.9 kg)  10/09/20 136 lb (61.7 kg)  09/08/20 139 lb 3.2 oz (63.1 kg)      Other studies Reviewed: Additional studies/ records that were reviewed today include: LHC 08/14/20  Review of the above records today demonstrates:  Mid LAD-1 lesion is 95% stenosed. A drug-eluting stent was successfully placed using a STENT RESOLUTE ONYX 3.0X30, postdilated to > 3.5 mm, and optimized with IVUS. Post intervention, there is a 0% residual stenosis. Mid LAD-2 lesion is 25% stenosed. The left ventricular systolic function is normal. LV end diastolic pressure is normal. LVEDP 14 mm Hg. The left ventricular ejection fraction is 55-65% by visual estimate. There is no aortic valve stenosis. A drug-eluting stent was successfully placed using a STENT RESOLUTE ONYX 3.0X30.  Cardiac monitor 09/21/2020 personally reviewed 2 runs of ventricular tachycardia with longest episode of 7 beats at rate of 121 bpm. 1 run of supraventricular tachycardia 8 beats at rate 144 bpm. Second-degree type I AV block noted. No symptoms reported  ASSESSMENT AND PLAN:  1.  Nonsustained VT: Fortunately all of her episodes of been quite  short with an ejection fraction of 50 to 55%.  As she is asymptomatic, we Darien Kading continue to treat with metoprolol.  No further therapy necessary at this time.  2.  Coronary artery disease: Status post stenting of the LAD.  No current chest pain.  Plan per primary cardiology.  3.  Hyperlipidemia: Continue statin per primary cardiology.  Case discussed with primary cardiology  Current medicines are reviewed at length with the patient today.   The patient does not have concerns regarding her medicines.  The following changes were made today:  none  Labs/ tests ordered today include:  Orders Placed This Encounter  Procedures   EKG 12-Lead     Disposition:   FU with Marcanthony Sleight PRN  Signed, Emrick Hensch Meredith Leeds, MD  01/08/2021 9:32 AM     [address] [place] [place] [address] [phone] (office) [phone] (fax)

## 2021-01-08 ENCOUNTER — Ambulatory Visit: Payer: Medicare Other | Admitting: Cardiology

## 2021-01-08 ENCOUNTER — Other Ambulatory Visit: Payer: Self-pay | Admitting: Cardiology

## 2021-01-08 ENCOUNTER — Encounter: Payer: Self-pay | Admitting: Cardiology

## 2021-01-08 ENCOUNTER — Other Ambulatory Visit: Payer: Self-pay

## 2021-01-08 VITALS — BP 98/60 | HR 65 | Ht 62.0 in | Wt 143.0 lb

## 2021-01-08 DIAGNOSIS — I2510 Atherosclerotic heart disease of native coronary artery without angina pectoris: Secondary | ICD-10-CM

## 2021-01-08 DIAGNOSIS — E782 Mixed hyperlipidemia: Secondary | ICD-10-CM | POA: Diagnosis not present

## 2021-01-08 DIAGNOSIS — I472 Ventricular tachycardia, unspecified: Secondary | ICD-10-CM

## 2021-01-08 DIAGNOSIS — I10 Essential (primary) hypertension: Secondary | ICD-10-CM | POA: Diagnosis not present

## 2021-01-08 NOTE — Progress Notes (Signed)
Cardiology Office Note:    Date:  01/08/2021   ID:  Kristi Burns, DOB [birth year], MRN [id number]  PCP:  Earlyne Iba, NP  Cardiologist:  Jenne Campus, MD    Referring MD: Earlyne Iba, NP   Chief Complaint  Patient presents with   Follow-up  My back is really bothering me  History of Present Illness:    Kristi Burns is a 75 y.o. female with past medical history significant for coronary artery disease history of non-STEMI in February 2022.  She came to [facility], troponin I was minimally elevated she was transferred to [facility], cardiac catheterization has been performed.  Stenting to mid LAD has been performed with drug-eluting stent.  She also had 25% stenosis distally.  Everything else looks good.  Ejection fraction at the time was 50 to 55%.  She did have however intermittent heart block when she was in the hospital.  I put monitor on her to see if she has any recurrences of AV conduction problem surprisingly fine ventricular tachycardia.  Today she is being seen by our EP team consideration is ventricle tachycardia however recommendation is simply monitoring.  She denies have any palpitations no dizziness no passing out.  Biggest problem is back pain and she would like to have a surgery next month if possible.  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   Hypothyroidism (acquired) 11/15/2015   Malaise and fatigue 11/15/2015   Mixed hyperlipidemia 11/15/2015   NSTEMI (non-ST elevated myocardial infarction) ([place]) 08/13/2020   Peripheral vascular disease ([place])  11/15/2015    Past Surgical History:  Procedure Laterality Date   CORONARY STENT INTERVENTION N/A 08/14/2020   Procedure: CORONARY STENT INTERVENTION;  Surgeon: Jettie Booze, MD;  Location: [facility];  Service: Cardiovascular;  Laterality: N/A;   INTRAVASCULAR ULTRASOUND/IVUS N/A 08/14/2020   Procedure: Intravascular Ultrasound/IVUS;  Surgeon: Jettie Booze, MD;  Location: [facility];  Service: Cardiovascular;  Laterality: N/A;   LEFT HEART CATH AND CORONARY ANGIOGRAPHY N/A 08/14/2020   Procedure: LEFT HEART CATH AND CORONARY ANGIOGRAPHY;  Surgeon: Jettie Booze, MD;  Location: [facility];  Service: Cardiovascular;  Laterality: N/A;    Current Medications: Current Meds  Medication Sig   acetaminophen (TYLENOL) 500 MG tablet Take 1,000 mg by mouth 2 (two) times daily as needed for mild pain or moderate pain (back pain). Take with ibuprofen   ALPRAZolam (XANAX) 0.5 MG tablet Take 0.5 mg by mouth daily as needed for anxiety.   Ascorbic Acid (VITAMIN C PO) Take 1 tablet by mouth every morning.   aspirin 81 MG chewable tablet Chew 1 tablet (81 mg total) by mouth daily.   atorvastatin (LIPITOR) 80 MG tablet Take 1 tablet (80 mg total) by mouth daily.   BIOTIN PO Take 1 tablet by mouth every morning. Unknown strength   Budesonide-Formoterol Fumarate (SYMBICORT IN) Inhale 2 puffs into the lungs daily as needed (shortness of breath).   Calcium Carbonate-Vitamin D (CALCIUM-D PO) Take 1 tablet by mouth 3 (three) times daily with meals.   [place]  PO Take 1 tablet by mouth at bedtime. Unknown strength   Cholecalciferol (VITAMIN D3 PO) Take 1 tablet by mouth every morning. Unknown strength   Cyanocobalamin (VITAMIN B-12 PO) Take 1 tablet by mouth every morning. Unknown strength   cyclobenzaprine (FLEXERIL) 5 MG tablet Take 5 mg by mouth at bedtime as needed for muscle spasms (back pain).   hydrochlorothiazide (HYDRODIURIL) 25 MG tablet Take 1  tablet by mouth daily.   levothyroxine (SYNTHROID) 88 MCG tablet Take 88 mcg by mouth daily.   lisinopril (ZESTRIL) 10 MG tablet Take 1 tablet (10 mg total) by mouth daily.   medroxyPROGESTERone (PROVERA) 2.5 MG tablet Take 2.5 mg by mouth every morning.   metoprolol succinate (TOPROL-XL) 25 MG 24 hr tablet Take 1 tablet (25 mg total) by mouth daily. Take with or immediately following a meal.   neomycin-polymyxin-hydrocortisone (CORTISPORIN) OTIC solution Apply 1-2 drops to toenails after shower once a day for at least 1 week   nitroGLYCERIN (NITROSTAT) 0.4 MG SL tablet PLACE 1 TABLET (0.4 MG TOTAL) UNDER THE TONGUE EVERY 5 (FIVE) MINUTES X 3 DOSES AS NEEDED FOR CHEST PAIN.   POTASSIUM PO Take 1 tablet by mouth every morning. Unknown strenght   prasugrel (EFFIENT) 10 MG TABS tablet Take 1 tablet (10 mg total) by mouth daily.     Allergies:   Other   Social History   Socioeconomic History   Marital status: Widowed    Spouse name: Not on file   Number of children: Not on file   Years of education: Not on file   Highest education level: Not on file  Occupational History   Not on file  Tobacco Use   Smoking status: Never   Smokeless tobacco: Never  Substance and Sexual Activity   Alcohol use: No   Drug use: No   Sexual activity: Not on file  Other Topics Concern   Not on file  Social History Narrative   Not on file   Social Determinants of Health   Financial Resource Strain: Not on file  Food Insecurity: Not on file  Transportation Needs: Not on file  Physical Activity: Not on file  Stress: Not on file  Social Connections: Not on file     Family History: The patient's family history includes Heart attack in her father; Lung cancer in her mother. ROS:   Please see the history of present illness.    All 14 point review of systems negative except as described per history of present illness  EKGs/Labs/Other Studies Reviewed:      Recent Labs: 08/13/2020: Magnesium 1.7;  TSH 7.050 08/15/2020: Hemoglobin 11.6; Platelets 204 09/08/2020: BUN 14; Creatinine, Ser 0.81; Potassium 3.9; Sodium 138 10/09/2020: ALT 5  Recent Lipid Panel    Component Value Date/Time   CHOL 89 (L) 10/09/2020 1147   TRIG 75 10/09/2020 1147   HDL 38 (L) 10/09/2020 1147   CHOLHDL 2.3 10/09/2020 1147   CHOLHDL 3.0 08/13/2020 1831   VLDL 16 08/13/2020 1831   LDLCALC 35 10/09/2020 1147    Physical Exam:    VS:  BP 98/60   Pulse 65   Ht '5\' 2"'$  (1.575 m)   Wt 143 lb (64.9 kg)   SpO2 96%   BMI 26.16 kg/m     Wt Readings from Last 3 Encounters:  01/08/21 143 lb (64.9 kg)  01/08/21 143 lb (64.9 kg)  10/09/20 136 lb (61.7 kg)     GEN:  Well nourished, well developed in no acute distress  HEENT: Normal NECK: No JVD; No carotid bruits LYMPHATICS: No lymphadenopathy CARDIAC: RRR, no murmurs, no rubs, no gallops RESPIRATORY:  Clear to auscultation without rales, wheezing or rhonchi  ABDOMEN: Soft, non-tender, non-distended MUSCULOSKELETAL:  No edema; No deformity  SKIN: Warm and dry LOWER EXTREMITIES: no swelling NEUROLOGIC:  Alert and oriented x 3 PSYCHIATRIC:  Normal affect   ASSESSMENT:    1. Coronary artery disease involving native coronary artery of native heart without angina pectoris   2. Essential hypertension   3. Ventricular tachycardia ([place])   4. Mixed hyperlipidemia    PLAN:    In order of problems listed above:  Coronary artery disease status post PTCA and stenting of mid LAD.  Dual antiplatelet therapy.  It was done in face of non-STEMI, therefore, recommendation should be continued dual antiplatelet therapy for 1 year.  However she suffered so much from her back then she insisted on having surgery done as quickly as possible.  I think it will be feasible to do it next months which will be 6 months after intervention.  Her dual antiplatelet therapy can be withdrawn for what ever time needed for surgical intervention at then restart as soon as feasible from  surgical point of view. Essential hypertension blood pressure seems to be on the lower side.  We will continue present management. Ventricular tachycardia she was seen by EP team no need to intervene except continuation of beta-blocker.  We will not increase the dose of medication because of history of AV block. Mixed dyslipidemia fasting lipid profile reviewed with the patient.  She is taking high intense statin which I will continue and her LDL is 35 with HDL 38.  We will continue present management. Cardiovascular preop evaluation for possible back surgery.  From cardiac standpoint to view should be acceptable to do it we next month we over the 6 months after intervention.  Dual antiplatelet therapy can be stopped for short period of time.   Medication Adjustments/Labs and Tests Ordered: Current medicines are reviewed at length with the patient today.  Concerns regarding medicines are outlined above.  No orders of the defined types were placed in this encounter.  Medication changes: No orders of the defined types were placed in this encounter.   Signed, Park Liter, MD, [facility] 01/08/2021 9:37 AM    [place]

## 2021-01-08 NOTE — Patient Instructions (Signed)

## 2021-01-09 ENCOUNTER — Other Ambulatory Visit: Payer: Self-pay | Admitting: Cardiology

## 2021-01-29 DIAGNOSIS — M9901 Segmental and somatic dysfunction of cervical region: Secondary | ICD-10-CM | POA: Diagnosis not present

## 2021-01-29 DIAGNOSIS — M5413 Radiculopathy, cervicothoracic region: Secondary | ICD-10-CM | POA: Diagnosis not present

## 2021-01-29 DIAGNOSIS — M50323 Other cervical disc degeneration at C6-C7 level: Secondary | ICD-10-CM | POA: Diagnosis not present

## 2021-01-29 DIAGNOSIS — M546 Pain in thoracic spine: Secondary | ICD-10-CM | POA: Diagnosis not present

## 2021-01-30 ENCOUNTER — Ambulatory Visit: Payer: Medicare Other | Admitting: Sports Medicine

## 2021-02-01 DIAGNOSIS — M546 Pain in thoracic spine: Secondary | ICD-10-CM | POA: Diagnosis not present

## 2021-02-01 DIAGNOSIS — M50323 Other cervical disc degeneration at C6-C7 level: Secondary | ICD-10-CM | POA: Diagnosis not present

## 2021-02-01 DIAGNOSIS — M5413 Radiculopathy, cervicothoracic region: Secondary | ICD-10-CM | POA: Diagnosis not present

## 2021-02-01 DIAGNOSIS — M9901 Segmental and somatic dysfunction of cervical region: Secondary | ICD-10-CM | POA: Diagnosis not present

## 2021-02-06 DIAGNOSIS — M9901 Segmental and somatic dysfunction of cervical region: Secondary | ICD-10-CM | POA: Diagnosis not present

## 2021-02-06 DIAGNOSIS — M50323 Other cervical disc degeneration at C6-C7 level: Secondary | ICD-10-CM | POA: Diagnosis not present

## 2021-02-06 DIAGNOSIS — M5413 Radiculopathy, cervicothoracic region: Secondary | ICD-10-CM | POA: Diagnosis not present

## 2021-02-06 DIAGNOSIS — M546 Pain in thoracic spine: Secondary | ICD-10-CM | POA: Diagnosis not present

## 2021-02-07 DIAGNOSIS — M5413 Radiculopathy, cervicothoracic region: Secondary | ICD-10-CM | POA: Diagnosis not present

## 2021-02-07 DIAGNOSIS — M546 Pain in thoracic spine: Secondary | ICD-10-CM | POA: Diagnosis not present

## 2021-02-07 DIAGNOSIS — M9901 Segmental and somatic dysfunction of cervical region: Secondary | ICD-10-CM | POA: Diagnosis not present

## 2021-02-07 DIAGNOSIS — M50323 Other cervical disc degeneration at C6-C7 level: Secondary | ICD-10-CM | POA: Diagnosis not present

## 2021-02-13 DIAGNOSIS — M50323 Other cervical disc degeneration at C6-C7 level: Secondary | ICD-10-CM | POA: Diagnosis not present

## 2021-02-13 DIAGNOSIS — M546 Pain in thoracic spine: Secondary | ICD-10-CM | POA: Diagnosis not present

## 2021-02-13 DIAGNOSIS — M9901 Segmental and somatic dysfunction of cervical region: Secondary | ICD-10-CM | POA: Diagnosis not present

## 2021-02-13 DIAGNOSIS — M5413 Radiculopathy, cervicothoracic region: Secondary | ICD-10-CM | POA: Diagnosis not present

## 2021-02-15 DIAGNOSIS — M5413 Radiculopathy, cervicothoracic region: Secondary | ICD-10-CM | POA: Diagnosis not present

## 2021-02-15 DIAGNOSIS — M546 Pain in thoracic spine: Secondary | ICD-10-CM | POA: Diagnosis not present

## 2021-02-15 DIAGNOSIS — M50323 Other cervical disc degeneration at C6-C7 level: Secondary | ICD-10-CM | POA: Diagnosis not present

## 2021-02-15 DIAGNOSIS — M9901 Segmental and somatic dysfunction of cervical region: Secondary | ICD-10-CM | POA: Diagnosis not present

## 2021-02-21 DIAGNOSIS — M5413 Radiculopathy, cervicothoracic region: Secondary | ICD-10-CM | POA: Diagnosis not present

## 2021-02-21 DIAGNOSIS — M9901 Segmental and somatic dysfunction of cervical region: Secondary | ICD-10-CM | POA: Diagnosis not present

## 2021-02-21 DIAGNOSIS — M50323 Other cervical disc degeneration at C6-C7 level: Secondary | ICD-10-CM | POA: Diagnosis not present

## 2021-02-21 DIAGNOSIS — M546 Pain in thoracic spine: Secondary | ICD-10-CM | POA: Diagnosis not present

## 2021-02-22 DIAGNOSIS — M546 Pain in thoracic spine: Secondary | ICD-10-CM | POA: Diagnosis not present

## 2021-02-22 DIAGNOSIS — M50323 Other cervical disc degeneration at C6-C7 level: Secondary | ICD-10-CM | POA: Diagnosis not present

## 2021-02-22 DIAGNOSIS — M9901 Segmental and somatic dysfunction of cervical region: Secondary | ICD-10-CM | POA: Diagnosis not present

## 2021-02-22 DIAGNOSIS — M5413 Radiculopathy, cervicothoracic region: Secondary | ICD-10-CM | POA: Diagnosis not present

## 2021-02-27 DIAGNOSIS — M9901 Segmental and somatic dysfunction of cervical region: Secondary | ICD-10-CM | POA: Diagnosis not present

## 2021-02-27 DIAGNOSIS — M546 Pain in thoracic spine: Secondary | ICD-10-CM | POA: Diagnosis not present

## 2021-02-27 DIAGNOSIS — M50323 Other cervical disc degeneration at C6-C7 level: Secondary | ICD-10-CM | POA: Diagnosis not present

## 2021-02-27 DIAGNOSIS — M5413 Radiculopathy, cervicothoracic region: Secondary | ICD-10-CM | POA: Diagnosis not present

## 2021-02-28 DIAGNOSIS — M5413 Radiculopathy, cervicothoracic region: Secondary | ICD-10-CM | POA: Diagnosis not present

## 2021-02-28 DIAGNOSIS — M50323 Other cervical disc degeneration at C6-C7 level: Secondary | ICD-10-CM | POA: Diagnosis not present

## 2021-02-28 DIAGNOSIS — M546 Pain in thoracic spine: Secondary | ICD-10-CM | POA: Diagnosis not present

## 2021-02-28 DIAGNOSIS — M9901 Segmental and somatic dysfunction of cervical region: Secondary | ICD-10-CM | POA: Diagnosis not present

## 2021-03-01 DIAGNOSIS — M546 Pain in thoracic spine: Secondary | ICD-10-CM | POA: Diagnosis not present

## 2021-03-01 DIAGNOSIS — M9901 Segmental and somatic dysfunction of cervical region: Secondary | ICD-10-CM | POA: Diagnosis not present

## 2021-03-01 DIAGNOSIS — M5413 Radiculopathy, cervicothoracic region: Secondary | ICD-10-CM | POA: Diagnosis not present

## 2021-03-01 DIAGNOSIS — M50323 Other cervical disc degeneration at C6-C7 level: Secondary | ICD-10-CM | POA: Diagnosis not present

## 2021-03-14 DIAGNOSIS — M4722 Other spondylosis with radiculopathy, cervical region: Secondary | ICD-10-CM | POA: Diagnosis not present

## 2021-03-14 DIAGNOSIS — M542 Cervicalgia: Secondary | ICD-10-CM | POA: Diagnosis not present

## 2021-03-14 DIAGNOSIS — M4802 Spinal stenosis, cervical region: Secondary | ICD-10-CM | POA: Diagnosis not present

## 2021-03-20 ENCOUNTER — Other Ambulatory Visit: Payer: Self-pay | Admitting: Cardiology

## 2021-03-29 DIAGNOSIS — M542 Cervicalgia: Secondary | ICD-10-CM | POA: Diagnosis not present

## 2021-03-29 DIAGNOSIS — M4802 Spinal stenosis, cervical region: Secondary | ICD-10-CM | POA: Diagnosis not present

## 2021-04-18 DIAGNOSIS — M50121 Cervical disc disorder at C4-C5 level with radiculopathy: Secondary | ICD-10-CM | POA: Diagnosis not present

## 2021-04-18 DIAGNOSIS — M5001 Cervical disc disorder with myelopathy,  high cervical region: Secondary | ICD-10-CM | POA: Diagnosis not present

## 2021-04-18 DIAGNOSIS — G894 Chronic pain syndrome: Secondary | ICD-10-CM | POA: Diagnosis not present

## 2021-04-18 DIAGNOSIS — Z79891 Long term (current) use of opiate analgesic: Secondary | ICD-10-CM | POA: Diagnosis not present

## 2021-04-18 DIAGNOSIS — M4802 Spinal stenosis, cervical region: Secondary | ICD-10-CM | POA: Diagnosis not present

## 2021-04-18 DIAGNOSIS — M542 Cervicalgia: Secondary | ICD-10-CM | POA: Diagnosis not present

## 2021-04-18 DIAGNOSIS — M4722 Other spondylosis with radiculopathy, cervical region: Secondary | ICD-10-CM | POA: Diagnosis not present

## 2021-04-25 ENCOUNTER — Telehealth: Payer: Self-pay | Admitting: Cardiology

## 2021-04-25 NOTE — Telephone Encounter (Signed)
Pt coming in to follow up on a pre op clearance she dropped off last Tuesday.  Please advise   [phone]  Thank you!  Ammie Dalton

## 2021-04-26 NOTE — Telephone Encounter (Signed)
Spoke to patient. She reports she dropped a clearance form off last Tuesday in [place]. It is no uploaded in chart yet. Will check with staff who may have seen it.

## 2021-04-27 ENCOUNTER — Telehealth: Payer: Self-pay | Admitting: Emergency Medicine

## 2021-04-27 NOTE — Telephone Encounter (Signed)
   [facility] Pre-operative Risk Assessment    Patient Name: Kristi Burns  DOB: 11-12-1945 MRN: [id number]  HEARTCARE STAFF:  - IMPORTANT!!!!!! Under Visit Info/Reason for Call, type in Other and utilize the format Clearance MM/DD/YY or Clearance TBD. Do not use dashes or single digits. - Please review there is not already an duplicate clearance open for this procedure. - If request is for dental extraction, please clarify the # of teeth to be extracted. - If the patient is currently at the dentist's office, call Pre-Op Callback Staff (MA/nurse) to input urgent request.  - If the patient is not currently in the dentist office, please route to the Pre-Op pool.  Request for surgical clearance:  What type of surgery is being performed? ACDF C3-5 w/ICBG  When is this surgery scheduled? Not scheduled   What type of clearance is required (medical clearance vs. Pharmacy clearance to hold med vs. Both)? Both  Are there any medications that need to be held prior to surgery and how long? Aspirin  Practice name and name of physician performing surgery? [facility], Dr. Rennis Harding   What is the office phone number? [phone]   7.   What is the office fax number? [phone]  8.   Anesthesia type (None, local, MAC, general) ? General    Senaida Ores 04/27/2021, 12:41 PM  _________________________________________________________________   (provider comments below)

## 2021-04-27 NOTE — Telephone Encounter (Signed)
    Patient Name: Kristi Burns  DOB: Dec 03, 1945 MRN: [id number]  Primary Cardiologist: Jenean Lindau, MD  Chart reviewed as part of pre-operative protocol coverage.   Per Dr. Wendy Poet last note 01/08/21 "Cardiovascular preop evaluation for possible back surgery.  From cardiac standpoint to view should be acceptable to do it we next month we over the 6 months after intervention.  Dual antiplatelet therapy can be stopped for short period of time".  I have left voice mail to call back to go over cardiac symptoms prior to final clearance.   [place], [place] 04/27/2021, 2:27 PM

## 2021-04-27 NOTE — Telephone Encounter (Signed)
Spoke with the patient.   Given past medical history and time since last visit, based on ACC/AHA guidelines, Kristi Burns would be at acceptable risk for the planned procedure without further cardiovascular testing.   The patient was advised that if she develops new symptoms prior to surgery to contact our office to arrange for a follow-up visit, and she verbalized understanding.  The patient is on ASA and Effient. Dr. Agustin Cree, Please provide your recommendations about DAPT.   Please forward your response to P CV [facility] PREOP.   Thank you   Leanor Kail, PA 04/27/2021, 4:37 PM

## 2021-04-27 NOTE — Telephone Encounter (Signed)
Found the clearance. Patient aware we will input into system.

## 2021-04-30 DIAGNOSIS — E039 Hypothyroidism, unspecified: Secondary | ICD-10-CM | POA: Diagnosis not present

## 2021-04-30 DIAGNOSIS — R5383 Other fatigue: Secondary | ICD-10-CM | POA: Diagnosis not present

## 2021-04-30 DIAGNOSIS — Z01818 Encounter for other preprocedural examination: Secondary | ICD-10-CM | POA: Diagnosis not present

## 2021-05-01 NOTE — Telephone Encounter (Signed)
   Name: Kristi Burns DOB: Dec 07, 1945  MRN: [id number]  Primary Cardiologist: Jenne Campus, MD  Chart reviewed as part of pre-operative protocol coverage.   75 yo female with hx of coronary artery disease s/p NSTEMI in Feb 2022 treated with DES to the mLAD.  EF has been preserved.  She was noted to have NSVT on recent OP monitor.  She was seen by EP and recommendation was to continue beta-blocker only.    She should ideally remain on dual antiplatelet therapy with aspirin and prasugrel (Effient) for 12 mos after undergoing PCI in the setting of myocardial infarction.  Dual antiplatelet therapy can be interrupted after 6 mos if the surgery is urgent and cannot be put off for a full 12 mos.    She was last seen by Dr. Agustin Cree in July 2022.   Patient was contacted 05/01/2021 in reference to pre-operative risk assessment for pending surgery as outlined below.    Since last seen, [name] has done well without chest pain, shortness of breath.  She is very limited by her back and neck pain.  She has difficulty moving and sitting.  She is having HAs and her quality of life is poor.  I reviewed the patient's case again with Dr. Agustin Cree by phone today.  He agrees that, in light of her poor quality of life and ongoing pain, as long as she is willing to accept some increased risk of coming off antiplatelet therapy earlier than 12 mos, she may proceed.  I reviewed this with the patient by phone.  She is aware of and is willing to take the risk.  Recommendations: The patient will be at slightly increased risk of perioperative CV complications by coming off of antiplatelet therapy prior to 1 year after her myocardial infarction, as outlined above.  However, she is agreeable to this risk.  Her CV risk is not prohibitive.  Therefore, she may proceed with her surgery as planned.   ASA and Prasugrel (Effient) can be held for 5-7 days prior to the procedure and should be resumed as soon as  possible when felt to be safe from a bleeding perspective.     Please call with questions. Richardson Dopp, PA-C 05/01/2021, 11:20 AM

## 2021-05-01 NOTE — Telephone Encounter (Signed)
Notes faxed to surgeon. This phone note will be removed from the preop pool. Richardson Dopp, PA-C  05/01/2021 11:46 AM

## 2021-05-14 DIAGNOSIS — M5001 Cervical disc disorder with myelopathy,  high cervical region: Secondary | ICD-10-CM | POA: Diagnosis not present

## 2021-05-14 DIAGNOSIS — M542 Cervicalgia: Secondary | ICD-10-CM | POA: Diagnosis not present

## 2021-05-14 DIAGNOSIS — M50121 Cervical disc disorder at C4-C5 level with radiculopathy: Secondary | ICD-10-CM | POA: Diagnosis not present

## 2021-05-14 DIAGNOSIS — M4722 Other spondylosis with radiculopathy, cervical region: Secondary | ICD-10-CM | POA: Diagnosis not present

## 2021-05-14 DIAGNOSIS — M7918 Myalgia, other site: Secondary | ICD-10-CM | POA: Diagnosis not present

## 2021-05-24 DIAGNOSIS — D649 Anemia, unspecified: Secondary | ICD-10-CM | POA: Diagnosis not present

## 2021-05-24 DIAGNOSIS — N289 Disorder of kidney and ureter, unspecified: Secondary | ICD-10-CM | POA: Diagnosis not present

## 2021-06-15 DIAGNOSIS — M4722 Other spondylosis with radiculopathy, cervical region: Secondary | ICD-10-CM | POA: Diagnosis not present

## 2021-06-15 DIAGNOSIS — M5001 Cervical disc disorder with myelopathy,  high cervical region: Secondary | ICD-10-CM | POA: Diagnosis not present

## 2021-06-15 DIAGNOSIS — M50121 Cervical disc disorder at C4-C5 level with radiculopathy: Secondary | ICD-10-CM | POA: Diagnosis not present

## 2021-06-15 DIAGNOSIS — M542 Cervicalgia: Secondary | ICD-10-CM | POA: Diagnosis not present

## 2021-06-21 DIAGNOSIS — I739 Peripheral vascular disease, unspecified: Secondary | ICD-10-CM | POA: Diagnosis not present

## 2021-06-21 DIAGNOSIS — Z79899 Other long term (current) drug therapy: Secondary | ICD-10-CM | POA: Diagnosis not present

## 2021-06-21 DIAGNOSIS — Z7902 Long term (current) use of antithrombotics/antiplatelets: Secondary | ICD-10-CM | POA: Diagnosis not present

## 2021-06-21 DIAGNOSIS — Z01810 Encounter for preprocedural cardiovascular examination: Secondary | ICD-10-CM | POA: Diagnosis not present

## 2021-06-21 DIAGNOSIS — Z7982 Long term (current) use of aspirin: Secondary | ICD-10-CM | POA: Diagnosis not present

## 2021-06-21 DIAGNOSIS — M5001 Cervical disc disorder with myelopathy,  high cervical region: Secondary | ICD-10-CM | POA: Diagnosis not present

## 2021-06-21 DIAGNOSIS — Z01812 Encounter for preprocedural laboratory examination: Secondary | ICD-10-CM | POA: Diagnosis not present

## 2021-06-21 DIAGNOSIS — Z01818 Encounter for other preprocedural examination: Secondary | ICD-10-CM | POA: Diagnosis not present

## 2021-06-21 DIAGNOSIS — Z5181 Encounter for therapeutic drug level monitoring: Secondary | ICD-10-CM | POA: Diagnosis not present

## 2021-06-26 DIAGNOSIS — M542 Cervicalgia: Secondary | ICD-10-CM | POA: Diagnosis not present

## 2021-06-26 DIAGNOSIS — M4802 Spinal stenosis, cervical region: Secondary | ICD-10-CM | POA: Diagnosis not present

## 2021-06-26 DIAGNOSIS — M4322 Fusion of spine, cervical region: Secondary | ICD-10-CM | POA: Diagnosis not present

## 2021-06-26 DIAGNOSIS — Z4682 Encounter for fitting and adjustment of non-vascular catheter: Secondary | ICD-10-CM | POA: Diagnosis not present

## 2021-06-26 DIAGNOSIS — M4722 Other spondylosis with radiculopathy, cervical region: Secondary | ICD-10-CM | POA: Insufficient documentation

## 2021-06-26 DIAGNOSIS — M4312 Spondylolisthesis, cervical region: Secondary | ICD-10-CM | POA: Diagnosis not present

## 2021-06-26 DIAGNOSIS — M7989 Other specified soft tissue disorders: Secondary | ICD-10-CM | POA: Diagnosis not present

## 2021-06-26 DIAGNOSIS — Z981 Arthrodesis status: Secondary | ICD-10-CM | POA: Diagnosis not present

## 2021-06-26 DIAGNOSIS — M4712 Other spondylosis with myelopathy, cervical region: Secondary | ICD-10-CM | POA: Diagnosis not present

## 2021-06-26 DIAGNOSIS — M50121 Cervical disc disorder at C4-C5 level with radiculopathy: Secondary | ICD-10-CM | POA: Diagnosis not present

## 2021-06-26 DIAGNOSIS — M5001 Cervical disc disorder with myelopathy,  high cervical region: Secondary | ICD-10-CM | POA: Diagnosis not present

## 2021-07-16 DIAGNOSIS — M50121 Cervical disc disorder at C4-C5 level with radiculopathy: Secondary | ICD-10-CM | POA: Diagnosis not present

## 2021-07-26 ENCOUNTER — Ambulatory Visit: Payer: Medicare Other | Admitting: Cardiology

## 2021-07-26 ENCOUNTER — Other Ambulatory Visit: Payer: Self-pay

## 2021-07-26 ENCOUNTER — Encounter: Payer: Self-pay | Admitting: Cardiology

## 2021-07-26 VITALS — BP 100/70 | HR 64 | Ht 63.0 in | Wt 154.6 lb

## 2021-07-26 DIAGNOSIS — I472 Ventricular tachycardia, unspecified: Secondary | ICD-10-CM

## 2021-07-26 DIAGNOSIS — I2510 Atherosclerotic heart disease of native coronary artery without angina pectoris: Secondary | ICD-10-CM

## 2021-07-26 DIAGNOSIS — E782 Mixed hyperlipidemia: Secondary | ICD-10-CM | POA: Diagnosis not present

## 2021-07-26 DIAGNOSIS — I10 Essential (primary) hypertension: Secondary | ICD-10-CM

## 2021-07-26 MED ORDER — ATORVASTATIN CALCIUM 20 MG PO TABS: 20.0000 mg | ORAL_TABLET | Freq: Every day | ORAL | 3 refills | Status: DC

## 2021-07-26 NOTE — Progress Notes (Signed)
Cardiology Office Note:    Date:  07/26/2021   ID:  Kristi Burns, DOB Mar 05, 1946, MRN [id number]  PCP:  Earlyne Iba, NP  Cardiologist:  Jenne Campus, MD    Referring MD: Earlyne Iba, NP   Chief Complaint  Patient presents with   Follow-up  Doing very well cardiac wise  History of Present Illness:    Kristi Burns is a 76 y.o. female   with past medical history significant for coronary artery disease history of non-STEMI in February 2022.  She came to [facility], troponin I was minimally elevated she was transferred to [facility], cardiac catheterization has been performed.  Stenting to mid LAD has been performed with drug-eluting stent.  She also had 25% stenosis distally.  Everything else looks good.  Ejection fraction at the time was 50 to 55%.  She did have however intermittent heart block when she was in the hospital.  I put monitor on her to see if she has any recurrences of AV conduction problem surprisingly fine ventricular tachycardia.   She comes today 2 to my office for follow-up.  Overall she is doing very well.  She denies having chest pain tightness squeezing pressure burning chest no palpitations dizziness swelling of lower extremities.  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   Hypothyroidism (acquired) 11/15/2015   Malaise and fatigue 11/15/2015   Mixed hyperlipidemia 11/15/2015   NSTEMI (non-ST elevated myocardial infarction) ([place]) 08/13/2020   Peripheral vascular disease ([place]) 11/15/2015    Past Surgical History:  Procedure Laterality Date    CORONARY STENT INTERVENTION N/A 08/14/2020   Procedure: CORONARY STENT INTERVENTION;  Surgeon: Jettie Booze, MD;  Location: [place] [facility];  Service: Cardiovascular;  Laterality: N/A;   INTRAVASCULAR ULTRASOUND/IVUS N/A 08/14/2020   Procedure: Intravascular Ultrasound/IVUS;  Surgeon: Jettie Booze, MD;  Location: [place] [facility];  Service: Cardiovascular;  Laterality: N/A;   LEFT HEART CATH AND CORONARY ANGIOGRAPHY N/A 08/14/2020   Procedure: LEFT HEART CATH AND CORONARY ANGIOGRAPHY;  Surgeon: Jettie Booze, MD;  Location: [place] [facility];  Service: Cardiovascular;  Laterality: N/A;    Current Medications: Current Meds  Medication Sig   acetaminophen (TYLENOL) 500 MG tablet Take 1,000 mg by mouth 2 (two) times daily as needed for mild pain or moderate pain (back pain). Take with ibuprofen   ALPRAZolam (XANAX) 0.5 MG tablet Take 0.5 mg by mouth daily as needed for anxiety.   Ascorbic Acid (VITAMIN C PO) Take 1 tablet by mouth every morning.   aspirin 81 MG chewable tablet Chew 1 tablet (81 mg total) by mouth daily.   BIOTIN PO Take 1 tablet by mouth every morning. Unknown strength   Budesonide-Formoterol Fumarate (SYMBICORT IN) Inhale 2 puffs into the lungs daily as needed (shortness of breath).   Calcium Carbonate-Vitamin D (CALCIUM-D PO) Take 1 tablet by mouth 3 (three) times daily with meals.   CALCIUM-MAGNESIUM-ZINC PO Take 1 tablet by mouth at bedtime. Unknown strength   Cholecalciferol (VITAMIN D3 PO) Take 1 tablet by mouth every morning. Unknown strength  Cyanocobalamin (VITAMIN B-12 PO) Take 1 tablet by mouth every morning. Unknown strength   cyclobenzaprine (FLEXERIL) 5 MG tablet Take 5 mg by mouth at bedtime as needed for muscle spasms (back pain).   hydrochlorothiazide (HYDRODIURIL) 25 MG tablet Take 1 tablet by mouth daily.   levothyroxine (SYNTHROID) 88 MCG tablet Take 88 mcg by mouth daily.   lisinopril (ZESTRIL) 10 MG tablet TAKE 1 TABLET BY MOUTH   DAILY (Patient taking differently: Take 10 mg by mouth daily.)   medroxyPROGESTERone (PROVERA) 2.5 MG tablet Take 2.5 mg by mouth every morning.   metoprolol succinate (TOPROL-XL) 25 MG 24 hr tablet TAKE 1 TABLET BY MOUTH  DAILY WITH OR IMMEDIATELY  FOLLOWING A MEAL   neomycin-polymyxin-hydrocortisone (CORTISPORIN) OTIC solution Apply 1-2 drops to toenails after shower once a day for at least 1 week (Patient taking differently: Place 1-2 drops into both ears once a week. Apply 1-2 drops to toenails after shower once a day for at least 1 week)   nitroGLYCERIN (NITROSTAT) 0.4 MG SL tablet PLACE 1 TABLET (0.4 MG TOTAL) UNDER THE TONGUE EVERY 5 (FIVE) MINUTES X 3 DOSES AS NEEDED FOR CHEST PAIN. (Patient taking differently: Place 0.4 mg under the tongue every 5 (five) minutes x 3 doses as needed for chest pain.)   POTASSIUM PO Take 1 tablet by mouth every morning. Unknown strenght   prasugrel (EFFIENT) 10 MG TABS tablet Take 1 tablet (10 mg total) by mouth daily.     Allergies:   Other   Social History   Socioeconomic History   Marital status: Widowed    Spouse name: Not on file   Number of children: Not on file   Years of education: Not on file   Highest education level: Not on file  Occupational History   Not on file  Tobacco Use   Smoking status: Never   Smokeless tobacco: Never  Substance and Sexual Activity   Alcohol use: No   Drug use: No   Sexual activity: Not on file  Other Topics Concern   Not on file  Social History Narrative   Not on file   Social Determinants of Health   Financial Resource Strain: Not on file  Food Insecurity: Not on file  Transportation Needs: Not on file  Physical Activity: Not on file  Stress: Not on file  Social Connections: Not on file     Family History: The patient's family history includes Heart attack in her father; Lung cancer in her mother. ROS:   Please see the history of present illness.    All 14 point review of systems negative  except as described per history of present illness  EKGs/Labs/Other Studies Reviewed:      Recent Labs: 08/13/2020: Magnesium 1.7; TSH 7.050 08/15/2020: Hemoglobin 11.6; Platelets 204 09/08/2020: BUN 14; Creatinine, Ser 0.81; Potassium 3.9; Sodium 138 10/09/2020: ALT 5  Recent Lipid Panel    Component Value Date/Time   CHOL 89 (L) 10/09/2020 1147   TRIG 75 10/09/2020 1147   HDL 38 (L) 10/09/2020 1147   CHOLHDL 2.3 10/09/2020 1147   CHOLHDL 3.0 08/13/2020 1831   VLDL 16 08/13/2020 1831   LDLCALC 35 10/09/2020 1147    Physical Exam:    VS:  BP 100/70 (BP Location: Left Arm, Patient Position: Sitting)    Pulse 64    Ht 5\' 3"  (1.6 m)    Wt 154 lb 9.6 oz (70.1 kg)    SpO2 99%    BMI 27.39 kg/m  Wt Readings from Last 3 Encounters:  07/26/21 154 lb 9.6 oz (70.1 kg)  01/08/21 143 lb (64.9 kg)  01/08/21 143 lb (64.9 kg)     GEN:  Well nourished, well developed in no acute distress HEENT: Normal NECK: No JVD; No carotid bruits LYMPHATICS: No lymphadenopathy CARDIAC: RRR, no murmurs, no rubs, no gallops RESPIRATORY:  Clear to auscultation without rales, wheezing or rhonchi  ABDOMEN: Soft, non-tender, non-distended MUSCULOSKELETAL:  No edema; No deformity  SKIN: Warm and dry LOWER EXTREMITIES: no swelling NEUROLOGIC:  Alert and oriented x 3 PSYCHIATRIC:  Normal affect   ASSESSMENT:    1. Coronary artery disease involving native coronary artery of native heart without angina pectoris   2. Ventricular tachycardia   3. Essential hypertension   4. Mixed hyperlipidemia    PLAN:    In order of problems listed above:  Coronary disease (post PTCA and stenting done in February 2022.  We will recheck 1 year anniversary of her stent implantation.  I told her that after a year anniversary she can discontinue her Effient.  Continue rest of the medication. Dyslipidemia.  She misunderstood our instructions last time and she stopped completely Lipitor.  Last K PN show me her LDL of 27  HDL 46 we asked him to reduce the dose of Lipitor but not completely discontinue.  I put her back on 20 mg Lipitor I will check her fasting lipid profile in 6 weeks. Essential hypertension: Blood pressure well controlled continue present management. Ventricular tachycardia as per recommendation by EP simply monitoring.  She have no dizziness no palpitations no syncope   Medication Adjustments/Labs and Tests Ordered: Current medicines are reviewed at length with the patient today.  Concerns regarding medicines are outlined above.  No orders of the defined types were placed in this encounter.  Medication changes: No orders of the defined types were placed in this encounter.   Signed, Park Liter, MD, [facility] 07/26/2021 2:39 PM    [place]

## 2021-07-26 NOTE — Addendum Note (Signed)
Addended by: Edwyna Shell I on: 07/26/2021 02:56 PM   Modules accepted: Orders

## 2021-07-26 NOTE — Patient Instructions (Signed)
Medication Instructions:  Your physician has recommended you make the following change in your medication:   START: Lipitor 20 mg daily  *If you need a refill on your cardiac medications before your next appointment, please call your pharmacy*   Lab Work: Your physician recommends that you return for lab work in: Labs in 6 weeks: Lipids If you have labs (blood work) drawn today and your tests are completely normal, you will receive your results only by: [place] (if you have [place]) OR A paper copy in the mail If you have any lab test that is abnormal or we need to change your treatment, we will call you to review the results.   Testing/Procedures: None   Follow-Up: At [facility], you and your health needs are our priority.  As part of our continuing mission to provide you with exceptional heart care, we have created designated Provider Care Teams.  These Care Teams include your primary Cardiologist (physician) and Advanced Practice Providers (APPs -  Physician Assistants and Nurse Practitioners) who all work together to provide you with the care you need, when you need it.  We recommend signing up for the patient portal called "MyChart".  Sign up information is provided on this After Visit Summary.  MyChart is used to connect with patients for Virtual Visits (Telemedicine).  Patients are able to view lab/test results, encounter notes, upcoming appointments, etc.  Non-urgent messages can be sent to your provider as well.   To learn more about what you can do with MyChart, go to [email or web address].    Your next appointment:   6 month(s)  The format for your next appointment:   In Person  Provider:   Jenne Campus, MD    Other Instructions Discontinue Effient on 1 year anniversary of stenting.

## 2021-08-01 ENCOUNTER — Other Ambulatory Visit: Payer: Self-pay

## 2021-08-01 MED ORDER — ATORVASTATIN CALCIUM 20 MG PO TABS: 20.0000 mg | ORAL_TABLET | Freq: Every day | ORAL | 3 refills | Status: DC

## 2021-08-29 DIAGNOSIS — M5432 Sciatica, left side: Secondary | ICD-10-CM | POA: Diagnosis not present

## 2021-09-03 DIAGNOSIS — I2510 Atherosclerotic heart disease of native coronary artery without angina pectoris: Secondary | ICD-10-CM | POA: Diagnosis not present

## 2021-09-03 DIAGNOSIS — I10 Essential (primary) hypertension: Secondary | ICD-10-CM | POA: Diagnosis not present

## 2021-09-03 DIAGNOSIS — E782 Mixed hyperlipidemia: Secondary | ICD-10-CM | POA: Diagnosis not present

## 2021-09-04 LAB — LIPID PANEL
Chol/HDL Ratio: 2.3 ratio (ref 0.0–4.4)
Cholesterol, Total: 114 mg/dL (ref 100–199)
HDL: 49 mg/dL (ref >39)
LDL Chol Calc (NIH): 47 mg/dL (ref 0–99)
Triglycerides: 97 mg/dL (ref 0–149)
VLDL Cholesterol Cal: 18 mg/dL (ref 5–40)

## 2021-09-27 ENCOUNTER — Ambulatory Visit: Payer: Medicare Other | Admitting: Podiatry

## 2021-09-27 ENCOUNTER — Encounter: Payer: Self-pay | Admitting: Podiatry

## 2021-09-27 DIAGNOSIS — M79674 Pain in right toe(s): Secondary | ICD-10-CM | POA: Diagnosis not present

## 2021-09-27 DIAGNOSIS — M79675 Pain in left toe(s): Secondary | ICD-10-CM | POA: Diagnosis not present

## 2021-09-27 DIAGNOSIS — B351 Tinea unguium: Secondary | ICD-10-CM

## 2021-10-07 NOTE — Progress Notes (Signed)
?  Subjective:  ?Patient ID: Kristi Burns, female    DOB: 05-26-1946,  MRN: [id number] ? ?Kristi Burns presents to clinic today for painful thick toenails that are difficult to trim. Pain interferes with ambulation. Aggravating factors include wearing enclosed shoe gear. Pain is relieved with periodic professional debridement. ? ?New problem(s): None.  ? ?PCP is Earlyne Iba, NP , and last visit was April 30, 2021. ? ?Allergies  ?Allergen Reactions  ? Other Anxiety  ?  Reaction to Antihistamines  ? ? ?Review of Systems: Negative except as noted in the HPI. ? ?Objective: No changes noted in today's physical examination. ?Objective:  ? ?Vascular Examination: ?CFT <3 seconds b/l LE. Faintly palpable pedal pulses b/l. Pedal hair sparse. No pain with calf compression b/l. Lower extremity skin temperature gradient within normal limits. ? ?Neurological Examination: ?Protective sensation intact 5/5 intact bilaterally with 10g monofilament b/l. ? ?Dermatological Examination: ? Pedal integument with normal turgor, texture and tone b/l LE. No open wounds b/l. No interdigital macerations b/l. Toenails 2-5 bilaterally and R hallux elongated, thickened, discolored with subungual debris. +Tenderness with dorsal palpation of nailplates. No hyperkeratotic or porokeratotic lesions present. Anonychia noted left great toe. Nailbed(s) epithelialized.  ? ?Musculoskeletal Examination: ?Normal muscle strength 5/5 to all lower extremity muscle groups bilaterally. No pain, crepitus or joint limitation noted with ROM b/l LE. No gross bony pedal deformities b/l. Patient ambulates independently without assistive aids. ? ?Radiographs: None ? ?Assessment/Plan: ?1. Pain due to onychomycosis of toenails of both feet   ?-Patient was evaluated and treated. All patient's and/or POA's questions/concerns answered on today's visit. ?-Patient to continue soft, supportive shoe gear daily. ?-Toenails 2-5 bilaterally and R hallux were debrided in  length and girth with sterile nail nippers and dremel. Pinpoint bleeding of R 4th toe addressed with Lumicain Hemostatic Solution, cleansed with alcohol. triple antibiotic ointment applied. No further treatment required by patient. Call office if she has any problems. ?-Patient/POA to call should there be question/concern in the interim.  ? ?Return in about 3 months (around 12/27/2021). ? ?Marzetta Board, DPM  ?

## 2021-10-18 DIAGNOSIS — Z139 Encounter for screening, unspecified: Secondary | ICD-10-CM | POA: Diagnosis not present

## 2021-10-18 DIAGNOSIS — E785 Hyperlipidemia, unspecified: Secondary | ICD-10-CM | POA: Diagnosis not present

## 2021-10-18 DIAGNOSIS — Z9181 History of falling: Secondary | ICD-10-CM | POA: Diagnosis not present

## 2021-10-18 DIAGNOSIS — I10 Essential (primary) hypertension: Secondary | ICD-10-CM | POA: Diagnosis not present

## 2021-10-18 DIAGNOSIS — E039 Hypothyroidism, unspecified: Secondary | ICD-10-CM | POA: Diagnosis not present

## 2021-10-22 DIAGNOSIS — M542 Cervicalgia: Secondary | ICD-10-CM | POA: Diagnosis not present

## 2021-10-22 DIAGNOSIS — M5416 Radiculopathy, lumbar region: Secondary | ICD-10-CM | POA: Diagnosis not present

## 2021-10-22 DIAGNOSIS — M4322 Fusion of spine, cervical region: Secondary | ICD-10-CM | POA: Diagnosis not present

## 2021-10-22 DIAGNOSIS — M4326 Fusion of spine, lumbar region: Secondary | ICD-10-CM | POA: Diagnosis not present

## 2021-10-22 DIAGNOSIS — M545 Low back pain, unspecified: Secondary | ICD-10-CM | POA: Diagnosis not present

## 2021-10-24 DIAGNOSIS — M4726 Other spondylosis with radiculopathy, lumbar region: Secondary | ICD-10-CM | POA: Diagnosis not present

## 2021-10-24 DIAGNOSIS — M5126 Other intervertebral disc displacement, lumbar region: Secondary | ICD-10-CM | POA: Diagnosis not present

## 2021-10-24 DIAGNOSIS — M47816 Spondylosis without myelopathy or radiculopathy, lumbar region: Secondary | ICD-10-CM | POA: Diagnosis not present

## 2021-11-05 DIAGNOSIS — I10 Essential (primary) hypertension: Secondary | ICD-10-CM | POA: Diagnosis not present

## 2021-11-05 DIAGNOSIS — E034 Atrophy of thyroid (acquired): Secondary | ICD-10-CM | POA: Diagnosis not present

## 2021-11-05 DIAGNOSIS — N289 Disorder of kidney and ureter, unspecified: Secondary | ICD-10-CM | POA: Diagnosis not present

## 2021-11-05 DIAGNOSIS — R7989 Other specified abnormal findings of blood chemistry: Secondary | ICD-10-CM | POA: Diagnosis not present

## 2021-11-30 DIAGNOSIS — M5416 Radiculopathy, lumbar region: Secondary | ICD-10-CM | POA: Diagnosis not present

## 2021-11-30 DIAGNOSIS — M4326 Fusion of spine, lumbar region: Secondary | ICD-10-CM | POA: Diagnosis not present

## 2021-12-04 ENCOUNTER — Other Ambulatory Visit: Payer: Self-pay | Admitting: Cardiology

## 2021-12-04 DIAGNOSIS — M5416 Radiculopathy, lumbar region: Secondary | ICD-10-CM | POA: Diagnosis not present

## 2021-12-05 NOTE — Telephone Encounter (Signed)
Rx refill sent to pharmacy. 

## 2021-12-14 ENCOUNTER — Other Ambulatory Visit: Payer: Self-pay | Admitting: Cardiology

## 2021-12-17 NOTE — Telephone Encounter (Signed)
Rx refill sent to pharmacy. 

## 2022-01-02 DIAGNOSIS — M5416 Radiculopathy, lumbar region: Secondary | ICD-10-CM | POA: Diagnosis not present

## 2022-01-02 DIAGNOSIS — M4326 Fusion of spine, lumbar region: Secondary | ICD-10-CM | POA: Diagnosis not present

## 2022-01-14 DIAGNOSIS — E785 Hyperlipidemia, unspecified: Secondary | ICD-10-CM | POA: Diagnosis not present

## 2022-01-14 DIAGNOSIS — E039 Hypothyroidism, unspecified: Secondary | ICD-10-CM | POA: Diagnosis not present

## 2022-01-14 DIAGNOSIS — I10 Essential (primary) hypertension: Secondary | ICD-10-CM | POA: Diagnosis not present

## 2022-01-23 ENCOUNTER — Ambulatory Visit: Payer: Medicare Other | Admitting: Cardiology

## 2022-01-23 ENCOUNTER — Telehealth: Payer: Self-pay

## 2022-01-23 ENCOUNTER — Encounter: Payer: Self-pay | Admitting: Cardiology

## 2022-01-23 VITALS — BP 106/60 | HR 62 | Ht 62.0 in | Wt 158.4 lb

## 2022-01-23 DIAGNOSIS — I2510 Atherosclerotic heart disease of native coronary artery without angina pectoris: Secondary | ICD-10-CM | POA: Diagnosis not present

## 2022-01-23 DIAGNOSIS — I10 Essential (primary) hypertension: Secondary | ICD-10-CM | POA: Diagnosis not present

## 2022-01-23 DIAGNOSIS — E782 Mixed hyperlipidemia: Secondary | ICD-10-CM

## 2022-01-23 NOTE — Telephone Encounter (Signed)
   [facility] Pre-operative Risk Assessment    Request for surgical clearance:  What type of surgery is being performed? Revision L2-S1 PSF TLIF laminectomy   When is this surgery scheduled? TBD   What type of clearance is required (medical clearance vs. Pharmacy clearance to hold med vs. Both)? Both  Are there any medications that need to be held prior to surgery and how long?Aspirin 81 mg   Practice name and name of physician performing surgery? Dr. Rennis Harding at [facility]   What is your office phone number: [phone]    7.   What is your office fax number: [phone]  8.   Anesthesia type (None, local, MAC, general) ? General   Kristi Burns Kristi Burns 01/23/2022, 11:34 AM  _________________________________________________________________   (provider comments below)

## 2022-01-23 NOTE — Progress Notes (Signed)
Cardiology Office Note:    Date:  01/23/2022   ID:  Kristi Burns, DOB Oct 27, 1945, MRN [id number]  PCP:  Earlyne Iba, NP  Cardiologist:  Jenne Campus, MD    Referring MD: Earlyne Iba, NP   Chief Complaint  Patient presents with   Clearance TBD    Revision L2-S1 PSF TLIF Laminectomy Dr Patrice Paradise     History of Present Illness:    Kristi Burns is a 76 y.o. female   with past medical history significant for coronary artery disease history of non-STEMI in February 2022.  She came to [facility], troponin I was minimally elevated she was transferred to [facility], cardiac catheterization has been performed.  Stenting to mid LAD has been performed with drug-eluting stent.  She also had 25% stenosis distally.  Everything else looks good.  Ejection fraction at the time was 50 to 55%.  She did have however intermittent heart block when she was in the hospital.  I put monitor on her to see if she has any recurrences of AV conduction problem surprisingly fine ventricular tachycardia.   Comes today 2 months for follow-up overall doing great cardiac wise.  She denies have any dizziness passing out palpitation chest pain tightness squeezing pressure burning chest.  She does have chronic problem with back and she is anticipating back surgery.  In spite of back pain she is able to walk she can go to [employer] and do shopping going from the front to the back with no difficulties.  She also got stairs at home she is able to climb 1 flight of stairs with no difficulties.  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   Hypothyroidism (acquired) 11/15/2015   Malaise and fatigue 11/15/2015   Mixed hyperlipidemia 11/15/2015   NSTEMI (non-ST elevated myocardial infarction) ([place]) 08/13/2020   Peripheral vascular disease ([place]) 11/15/2015    Past Surgical History:  Procedure Laterality Date   CORONARY STENT INTERVENTION N/A 08/14/2020   Procedure: CORONARY STENT INTERVENTION;  Surgeon: Jettie Booze, MD;  Location: [place] [facility];  Service: Cardiovascular;  Laterality: N/A;   INTRAVASCULAR ULTRASOUND/IVUS N/A 08/14/2020   Procedure: Intravascular Ultrasound/IVUS;  Surgeon: Jettie Booze, MD;  Location: [place] [facility];  Service: Cardiovascular;  Laterality: N/A;   LEFT HEART CATH AND CORONARY ANGIOGRAPHY N/A 08/14/2020   Procedure: LEFT HEART CATH AND CORONARY ANGIOGRAPHY;  Surgeon: Jettie Booze, MD;  Location: [place] [facility];  Service: Cardiovascular;  Laterality: N/A;    Current Medications: Current Meds  Medication Sig   acetaminophen (TYLENOL) 500 MG tablet Take 1,000 mg by mouth 2 (two) times daily as needed for mild pain or moderate pain (back pain). Take with ibuprofen   ALPRAZolam (XANAX) 0.5 MG tablet Take 0.5 mg by mouth daily as needed for anxiety.   aspirin 81 MG chewable tablet Chew 1 tablet (81 mg total) by mouth daily.   atorvastatin (LIPITOR) 20 MG tablet Take 1 tablet (20 mg total) by mouth daily.   BIOTIN PO Take 1 tablet by mouth every morning. Unknown strength  Cholecalciferol (VITAMIN D3 PO) Take 1 tablet by mouth every morning. Unknown strength   Cyanocobalamin (VITAMIN B-12 PO) Take 1 tablet by mouth every morning. Unknown strength   fenoprofen (NALFON) 600 MG TABS tablet Take 600 mg by mouth.   hydrochlorothiazide (HYDRODIURIL) 25 MG tablet Take 1 tablet by mouth daily.   levothyroxine (SYNTHROID) 88 MCG tablet Take 50 mcg by mouth daily.   lisinopril (ZESTRIL) 10 MG tablet Take 1 tablet (10 mg total) by mouth daily.    medroxyPROGESTERone (PROVERA) 2.5 MG tablet Take 2.5 mg by mouth every morning.   metoprolol succinate (TOPROL-XL) 25 MG 24 hr tablet TAKE 1 TABLET BY MOUTH  DAILY WITH OR IMMEDIATELY  FOLLOWING A MEAL (Patient taking differently: Take 25 mg by mouth daily.)   POTASSIUM PO Take 1 tablet by mouth every morning. Unknown strenght   [DISCONTINUED] Ascorbic Acid (VITAMIN C PO) Take 1 tablet by mouth every morning.   [DISCONTINUED] Budesonide-Formoterol Fumarate (SYMBICORT IN) Inhale 2 puffs into the lungs daily as needed (shortness of breath).   [DISCONTINUED] Calcium Carbonate-Vitamin D (CALCIUM-D PO) Take 1 tablet by mouth 3 (three) times daily with meals.   [DISCONTINUED] CALCIUM-MAGNESIUM-ZINC PO Take 1 tablet by mouth at bedtime. Unknown strength   [DISCONTINUED] cyclobenzaprine (FLEXERIL) 5 MG tablet Take 5 mg by mouth at bedtime as needed for muscle spasms (back pain).   [DISCONTINUED] neomycin-polymyxin-hydrocortisone (CORTISPORIN) OTIC solution Apply 1-2 drops to toenails after shower once a day for at least 1 week (Patient taking differently: Place 1-2 drops into both ears once a week. Apply 1-2 drops to toenails after shower once a day for at least 1 week)   [DISCONTINUED] nitroGLYCERIN (NITROSTAT) 0.4 MG SL tablet PLACE 1 TABLET (0.4 MG TOTAL) UNDER THE TONGUE EVERY 5 (FIVE) MINUTES X 3 DOSES AS NEEDED FOR CHEST PAIN. (Patient taking differently: Place 0.4 mg under the tongue every 5 (five) minutes x 3 doses as needed for chest pain.)   [DISCONTINUED] prasugrel (EFFIENT) 10 MG TABS tablet Take 1 tablet (10 mg total) by mouth daily.     Allergies:   Other   Social History   Socioeconomic History   Marital status: Widowed    Spouse name: Not on file   Number of children: Not on file   Years of education: Not on file   Highest education level: Not on file  Occupational History   Not on file  Tobacco Use   Smoking status: Never   Smokeless tobacco: Never  Substance and Sexual Activity    Alcohol use: No   Drug use: No   Sexual activity: Not on file  Other Topics Concern   Not on file  Social History Narrative   Not on file   Social Determinants of Health   Financial Resource Strain: Not on file  Food Insecurity: Not on file  Transportation Needs: Not on file  Physical Activity: Not on file  Stress: Not on file  Social Connections: Not on file     Family History: The patient's family history includes Heart attack in her father; Lung cancer in her mother. ROS:   Please see the history of present illness.    All 14 point review of systems negative except as described per history of present illness  EKGs/Labs/Other Studies Reviewed:      Recent Labs: No results found for requested labs within last 365 days.  Recent Lipid Panel    Component Value Date/Time   CHOL 114 09/03/2021 1623   TRIG 97 09/03/2021  1623   HDL 49 09/03/2021 1623   CHOLHDL 2.3 09/03/2021 1623   CHOLHDL 3.0 08/13/2020 1831   VLDL 16 08/13/2020 1831   LDLCALC 47 09/03/2021 1623    Physical Exam:    VS:  BP 106/60 (BP Location: Left Arm, Patient Position: Sitting)   Pulse 62   Ht '5\' 2"'$  (1.575 m)   Wt 158 lb 6.4 oz (71.8 kg)   SpO2 95%   BMI 28.97 kg/m     Wt Readings from Last 3 Encounters:  01/23/22 158 lb 6.4 oz (71.8 kg)  07/26/21 154 lb 9.6 oz (70.1 kg)  01/08/21 143 lb (64.9 kg)     GEN:  Well nourished, well developed in no acute distress HEENT: Normal NECK: No JVD; No carotid bruits LYMPHATICS: No lymphadenopathy CARDIAC: RRR, no murmurs, no rubs, no gallops RESPIRATORY:  Clear to auscultation without rales, wheezing or rhonchi  ABDOMEN: Soft, non-tender, non-distended MUSCULOSKELETAL:  No edema; No deformity  SKIN: Warm and dry LOWER EXTREMITIES: no swelling NEUROLOGIC:  Alert and oriented x 3 PSYCHIATRIC:  Normal affect   ASSESSMENT:    1. Coronary artery disease involving native coronary artery of native heart without angina pectoris   2. Essential  hypertension   3. Mixed hyperlipidemia    PLAN:    In order of problems listed above:  Coronary disease stable from that point review on antiplatelet therapy.  Antiplatelet therapy will be interrupted for her back surgery.  Overall doing well from that point review Essential hypertension blood pressure well-controlled we will continue present management.   Dyslipidemia I did review K PN which show me LDL 45 HDL 43 good cholesterol control we will continue present medication which include moderate intensity statin. Cardiovascular preop evaluation she is stable from cardiac standpoint reviewed to pursue surgical intervention on her back.  She does have any symptoms indicating reactivation of the problem in spite of chronic back pain she got pretty good exercise tolerance and easily gets 4 METS.  Her aspirin can be interrupted temporarily for her procedure   Medication Adjustments/Labs and Tests Ordered: Current medicines are reviewed at length with the patient today.  Concerns regarding medicines are outlined above.  Orders Placed This Encounter  Procedures   EKG 12-Lead   Medication changes: No orders of the defined types were placed in this encounter.   Signed, Park Liter, MD, [facility] 01/23/2022 12:01 PM    [place]

## 2022-01-23 NOTE — Patient Instructions (Signed)
Medication Instructions:  Your physician recommends that you continue on your current medications as directed. Please refer to the Current Medication list given to you today.  *If you need a refill on your cardiac medications before your next appointment, please call your pharmacy*   Lab Work: NONE If you have labs (blood work) drawn today and your tests are completely normal, you will receive your results only by: [place] (if you have MyChart) OR A paper copy in the mail If you have any lab test that is abnormal or we need to change your treatment, we will call you to review the results.   Testing/Procedures: NONE   Follow-Up: At [facility], you and your health needs are our priority.  As part of our continuing mission to provide you with exceptional heart care, we have created designated Provider Care Teams.  These Care Teams include your primary Cardiologist (physician) and Advanced Practice Providers (APPs -  Physician Assistants and Nurse Practitioners) who all work together to provide you with the care you need, when you need it.  We recommend signing up for the patient portal called "MyChart".  Sign up information is provided on this After Visit Summary.  MyChart is used to connect with patients for Virtual Visits (Telemedicine).  Patients are able to view lab/test results, encounter notes, upcoming appointments, etc.  Non-urgent messages can be sent to your provider as well.   To learn more about what you can do with MyChart, go to [email or web address].    Your next appointment:   6 month(s)  The format for your next appointment:   In Person  Provider:   Jenne Campus, MD    Other Instructions   Important Information About Sugar

## 2022-01-24 DIAGNOSIS — E785 Hyperlipidemia, unspecified: Secondary | ICD-10-CM | POA: Diagnosis not present

## 2022-01-24 DIAGNOSIS — Z01818 Encounter for other preprocedural examination: Secondary | ICD-10-CM | POA: Diagnosis not present

## 2022-01-24 DIAGNOSIS — E039 Hypothyroidism, unspecified: Secondary | ICD-10-CM | POA: Diagnosis not present

## 2022-01-24 NOTE — Telephone Encounter (Addendum)
   Patient Name: Kristi Burns  DOB: [birth year] MRN: [id number]  Primary Cardiologist: Jenne Campus, MD  Chart reviewed as part of pre-operative protocol coverage. Patient was seen yesterday in clinic for pre-op evaluation. The fax number below is not complete. I will route this message to callback staff to help clarify the fax number - please route MD note to requesting surgeon. Will otherwise remove this message from pre-op APP box.   Charlie Pitter, PA-C 01/24/2022, 7:47 AM

## 2022-01-24 NOTE — Telephone Encounter (Signed)
FAX # [phone]

## 2022-02-05 DIAGNOSIS — D649 Anemia, unspecified: Secondary | ICD-10-CM | POA: Diagnosis not present

## 2022-03-01 ENCOUNTER — Other Ambulatory Visit: Payer: Self-pay | Admitting: Cardiology

## 2022-03-08 DIAGNOSIS — M532X6 Spinal instabilities, lumbar region: Secondary | ICD-10-CM | POA: Diagnosis not present

## 2022-03-08 DIAGNOSIS — M5432 Sciatica, left side: Secondary | ICD-10-CM | POA: Diagnosis not present

## 2022-03-11 ENCOUNTER — Other Ambulatory Visit: Payer: Self-pay | Admitting: Cardiology

## 2022-03-13 DIAGNOSIS — M4716 Other spondylosis with myelopathy, lumbar region: Secondary | ICD-10-CM | POA: Diagnosis not present

## 2022-03-13 DIAGNOSIS — Z01812 Encounter for preprocedural laboratory examination: Secondary | ICD-10-CM | POA: Diagnosis not present

## 2022-03-13 DIAGNOSIS — N179 Acute kidney failure, unspecified: Secondary | ICD-10-CM | POA: Diagnosis not present

## 2022-03-19 DIAGNOSIS — M4306 Spondylolysis, lumbar region: Secondary | ICD-10-CM | POA: Diagnosis not present

## 2022-03-19 DIAGNOSIS — M810 Age-related osteoporosis without current pathological fracture: Secondary | ICD-10-CM | POA: Diagnosis not present

## 2022-03-19 DIAGNOSIS — M5432 Sciatica, left side: Secondary | ICD-10-CM | POA: Diagnosis not present

## 2022-03-19 DIAGNOSIS — M48061 Spinal stenosis, lumbar region without neurogenic claudication: Secondary | ICD-10-CM | POA: Diagnosis not present

## 2022-03-19 DIAGNOSIS — M5418 Radiculopathy, sacral and sacrococcygeal region: Secondary | ICD-10-CM | POA: Diagnosis not present

## 2022-03-19 DIAGNOSIS — M48062 Spinal stenosis, lumbar region with neurogenic claudication: Secondary | ICD-10-CM | POA: Diagnosis not present

## 2022-03-19 DIAGNOSIS — Z8739 Personal history of other diseases of the musculoskeletal system and connective tissue: Secondary | ICD-10-CM | POA: Diagnosis not present

## 2022-03-19 DIAGNOSIS — Z9889 Other specified postprocedural states: Secondary | ICD-10-CM | POA: Diagnosis not present

## 2022-03-19 DIAGNOSIS — Z87891 Personal history of nicotine dependence: Secondary | ICD-10-CM | POA: Diagnosis not present

## 2022-03-19 DIAGNOSIS — M47817 Spondylosis without myelopathy or radiculopathy, lumbosacral region: Secondary | ICD-10-CM | POA: Diagnosis not present

## 2022-03-19 DIAGNOSIS — M532X6 Spinal instabilities, lumbar region: Secondary | ICD-10-CM | POA: Diagnosis not present

## 2022-03-19 DIAGNOSIS — M4326 Fusion of spine, lumbar region: Secondary | ICD-10-CM | POA: Diagnosis not present

## 2022-03-19 DIAGNOSIS — I2510 Atherosclerotic heart disease of native coronary artery without angina pectoris: Secondary | ICD-10-CM | POA: Diagnosis not present

## 2022-03-19 DIAGNOSIS — I700 Atherosclerosis of aorta: Secondary | ICD-10-CM | POA: Diagnosis not present

## 2022-03-19 DIAGNOSIS — I252 Old myocardial infarction: Secondary | ICD-10-CM | POA: Diagnosis not present

## 2022-03-19 DIAGNOSIS — Z7951 Long term (current) use of inhaled steroids: Secondary | ICD-10-CM | POA: Diagnosis not present

## 2022-03-19 DIAGNOSIS — M7138 Other bursal cyst, other site: Secondary | ICD-10-CM | POA: Diagnosis not present

## 2022-03-19 DIAGNOSIS — M961 Postlaminectomy syndrome, not elsewhere classified: Secondary | ICD-10-CM | POA: Diagnosis not present

## 2022-03-19 DIAGNOSIS — M4716 Other spondylosis with myelopathy, lumbar region: Secondary | ICD-10-CM | POA: Diagnosis not present

## 2022-03-19 DIAGNOSIS — E785 Hyperlipidemia, unspecified: Secondary | ICD-10-CM | POA: Diagnosis not present

## 2022-03-19 DIAGNOSIS — Z955 Presence of coronary angioplasty implant and graft: Secondary | ICD-10-CM | POA: Diagnosis not present

## 2022-03-19 DIAGNOSIS — M5416 Radiculopathy, lumbar region: Secondary | ICD-10-CM | POA: Diagnosis not present

## 2022-03-19 DIAGNOSIS — G96191 Perineural cyst: Secondary | ICD-10-CM | POA: Diagnosis not present

## 2022-03-19 DIAGNOSIS — I10 Essential (primary) hypertension: Secondary | ICD-10-CM | POA: Diagnosis not present

## 2022-03-19 DIAGNOSIS — Z7982 Long term (current) use of aspirin: Secondary | ICD-10-CM | POA: Diagnosis not present

## 2022-03-19 DIAGNOSIS — M5136 Other intervertebral disc degeneration, lumbar region: Secondary | ICD-10-CM | POA: Diagnosis not present

## 2022-03-19 DIAGNOSIS — M4726 Other spondylosis with radiculopathy, lumbar region: Secondary | ICD-10-CM | POA: Diagnosis not present

## 2022-03-19 DIAGNOSIS — Z981 Arthrodesis status: Secondary | ICD-10-CM | POA: Diagnosis not present

## 2022-04-19 DIAGNOSIS — M4326 Fusion of spine, lumbar region: Secondary | ICD-10-CM | POA: Diagnosis not present

## 2022-04-24 DIAGNOSIS — E039 Hypothyroidism, unspecified: Secondary | ICD-10-CM | POA: Diagnosis not present

## 2022-04-24 DIAGNOSIS — E785 Hyperlipidemia, unspecified: Secondary | ICD-10-CM | POA: Diagnosis not present

## 2022-04-24 DIAGNOSIS — J45909 Unspecified asthma, uncomplicated: Secondary | ICD-10-CM | POA: Diagnosis not present

## 2022-04-24 DIAGNOSIS — I10 Essential (primary) hypertension: Secondary | ICD-10-CM | POA: Diagnosis not present

## 2022-05-01 ENCOUNTER — Telehealth: Payer: Self-pay | Admitting: Cardiology

## 2022-05-01 NOTE — Telephone Encounter (Signed)
Spoke with pt who states she had back surgery on 04/19/22. Pt states that last week she began feeling very tired and taking 2-3 hour long naps 2 times a day. Pt states they checked her BP and it was low. Pt reports that since stopping the medication she feels so much better. Pt reports that her heart rates have been from 61-84. Please advise

## 2022-05-01 NOTE — Telephone Encounter (Signed)
Pt c/o BP issue: STAT if pt c/o blurred vision, one-sided weakness or slurred speech  1. What are your last 5 BP readings?   126/68 this morning 114/56 8:30pm 11/14 120/57 3:30pm 113/58 12:15pm 117/60 9am  2. Are you having any other symptoms (ex. Dizziness, headache, blurred vision, passed out)? She gets lightheaded when she moves to fast.   3. What is your BP issue? Low BP.  She states on Saturday her BP was 90/48, 101/64.  She states she stop taking her lisinopril and metoprolol succinate since Saturday.

## 2022-05-02 NOTE — Telephone Encounter (Signed)
Recommendations reviewed with pt as per Dr. Krasowski's note.  Pt verbalized understanding and had no additional questions.  

## 2022-05-10 ENCOUNTER — Other Ambulatory Visit: Payer: Self-pay | Admitting: Cardiology

## 2022-05-16 DIAGNOSIS — R928 Other abnormal and inconclusive findings on diagnostic imaging of breast: Secondary | ICD-10-CM | POA: Diagnosis not present

## 2022-05-16 DIAGNOSIS — J029 Acute pharyngitis, unspecified: Secondary | ICD-10-CM | POA: Diagnosis not present

## 2022-05-16 DIAGNOSIS — R059 Cough, unspecified: Secondary | ICD-10-CM | POA: Diagnosis not present

## 2022-06-04 DIAGNOSIS — N6323 Unspecified lump in the left breast, lower outer quadrant: Secondary | ICD-10-CM | POA: Diagnosis not present

## 2022-06-04 DIAGNOSIS — R928 Other abnormal and inconclusive findings on diagnostic imaging of breast: Secondary | ICD-10-CM | POA: Diagnosis not present

## 2022-07-29 ENCOUNTER — Encounter: Payer: Self-pay | Admitting: Podiatry

## 2022-07-29 ENCOUNTER — Ambulatory Visit: Payer: Medicare Other | Admitting: Podiatry

## 2022-07-29 VITALS — BP 122/59 | HR 73

## 2022-07-29 DIAGNOSIS — M79674 Pain in right toe(s): Secondary | ICD-10-CM | POA: Diagnosis not present

## 2022-07-29 DIAGNOSIS — M79675 Pain in left toe(s): Secondary | ICD-10-CM | POA: Diagnosis not present

## 2022-07-29 DIAGNOSIS — L603 Nail dystrophy: Secondary | ICD-10-CM

## 2022-07-29 DIAGNOSIS — B351 Tinea unguium: Secondary | ICD-10-CM

## 2022-07-29 DIAGNOSIS — M79676 Pain in unspecified toe(s): Secondary | ICD-10-CM

## 2022-07-29 NOTE — Progress Notes (Signed)
Elderly Subjective:  Patient ID: Kristi Burns, female    DOB: 07/27/1945,  MRN: [id number]  Chief Complaint  Patient presents with   Nail Problem    "I have an ingrown toenail on my right big toe and I need to have all my nails trimmed."    77 y.o. female presents with concern for pain in the right great toenail.  Has a partial ingrown nail at the distal aspect of the lateral border of the right hallux nail.  She also wants her nails trimmed due to fungal dystrophy and pain.  Past Medical History:  Diagnosis Date   AKI (acute kidney injury) ([place]) 08/13/2020   Anxiety disorder 11/15/2015   Chest pain 08/13/2020   COPD (chronic obstructive pulmonary disease) ([place]) 08/13/2020   Coronary artery disease status post non-STEMI, PTCA and stent to mid LAD in March 2022 09/08/2020   Encounter for long-term current use of high risk medication 11/15/2015   Essential hypertension 11/15/2015   Herpes simplex type II infection 01/31/2017   Hypothyroidism (acquired) 11/15/2015   Malaise and fatigue 11/15/2015   Mixed hyperlipidemia 11/15/2015   NSTEMI (non-ST elevated myocardial infarction) ([place]) 08/13/2020   Peripheral vascular disease ([place]) 11/15/2015    Allergies  Allergen Reactions   Other Anxiety    Reaction to Antihistamines    ROS: Negative except as per HPI above  Objective:  General: AAO x3, NAD  Dermatological: Nails thickened elongated dystrophic and painful x 5 bilateral foot.  On the right hallux nail there is partial ingrown at the distal lateral border of the right hallux nail.  No erythema or drainage but significant pain with palpation of the area.  This was relieved after aggressive slant back procedure debridement of the nail.  Vascular:  Dorsalis Pedis artery and Posterior Tibial artery pedal pulses are 2/4 bilateral.  Capillary fill time < 3 sec to all digits.   Neruologic: Grossly intact via light touch bilateral. Protective threshold intact to all sites bilateral.    Musculoskeletal: No gross boney pedal deformities bilateral. No pain, crepitus, or limitation noted with foot and ankle range of motion bilateral. Muscular strength 5/5 in all groups tested bilateral.  Gait: Unassisted, Nonantalgic.   No images are attached to the encounter.   Assessment:   1. Pain due to onychomycosis of toenails of both feet   2. Pain around toenail   3. Onychodystrophy      Plan:  Patient was evaluated and treated and all questions answered.  #Onychomycosis with pain  # Ingrown nail to the right hallux nail lateral border. -Nails palliatively debrided as below. -Educated on self-care -Performed aggressive slant back procedure on the medial and lateral border of the right hallux nail patient felt great relief with removal of these ingrown borders with debridement does not require full ingrown nail procedure at this time however discusses that she may need that in the future.  Procedure: Nail Debridement Rationale: Pain Type of Debridement: manual, sharp debridement. Instrumentation: Nail nipper, rotary burr. Number of Nails: 10  Return if symptoms worsen or fail to improve.          Everitt Amber, DPM [facility]

## 2022-08-02 DIAGNOSIS — M8589 Other specified disorders of bone density and structure, multiple sites: Secondary | ICD-10-CM | POA: Diagnosis not present

## 2022-08-02 DIAGNOSIS — M810 Age-related osteoporosis without current pathological fracture: Secondary | ICD-10-CM | POA: Diagnosis not present

## 2022-08-09 ENCOUNTER — Telehealth: Payer: Self-pay | Admitting: Cardiology

## 2022-08-09 NOTE — Telephone Encounter (Signed)
Pt c/o medication issue:  1. Name of Medication:  Fosamax  2. How are you currently taking this medication (dosage and times per day)?   3. Are you having a reaction (difficulty breathing--STAT)?   4. What is your medication issue?   Patient states she recently found out that she has osteoporosis and her PCP prescribed Fosamax. She would like to make sure Dr. Agustin Cree agrees with this. Please advise.

## 2022-08-09 NOTE — Telephone Encounter (Signed)
Called patient and informed her that Dr. Bettina Gavia agrees with her PCP prescribing Fosamax for her osteoporosis. Patient was appreciative for the call and had no further questions at this time.

## 2022-08-15 DIAGNOSIS — E785 Hyperlipidemia, unspecified: Secondary | ICD-10-CM | POA: Diagnosis not present

## 2022-08-15 DIAGNOSIS — E039 Hypothyroidism, unspecified: Secondary | ICD-10-CM | POA: Diagnosis not present

## 2022-08-15 DIAGNOSIS — I10 Essential (primary) hypertension: Secondary | ICD-10-CM | POA: Diagnosis not present

## 2022-08-21 ENCOUNTER — Encounter: Payer: Self-pay | Admitting: Podiatry

## 2022-08-21 ENCOUNTER — Ambulatory Visit: Payer: Medicare Other | Admitting: Podiatry

## 2022-08-21 DIAGNOSIS — L600 Ingrowing nail: Secondary | ICD-10-CM | POA: Diagnosis not present

## 2022-08-21 DIAGNOSIS — B351 Tinea unguium: Secondary | ICD-10-CM

## 2022-08-21 DIAGNOSIS — M79674 Pain in right toe(s): Secondary | ICD-10-CM

## 2022-08-21 DIAGNOSIS — M79675 Pain in left toe(s): Secondary | ICD-10-CM

## 2022-08-21 NOTE — Patient Instructions (Signed)

## 2022-08-21 NOTE — Progress Notes (Signed)
Subjective:  Patient ID: ABBIE Burns, female    DOB: 18-Jan-1946,   MRN: [id number]  Chief Complaint  Patient presents with   Ingrown Toenail    right foot/2nd toe great toe ingrown    77 y.o. female presents for concern of right great toe and right second toenail ingrown nails. Appears patient has had issues with her nails in the past and had them trimmed. She relates she would like them trimmed today but would like her toes to be numbed up as they are very sensitive. Does have history of back issues and nerve pains. Has had left great toenail removed in the past.  Denies any other pedal complaints. Denies n/v/f/c.   Past Medical History:  Diagnosis Date   AKI (acute kidney injury) ([place]) 08/13/2020   Anxiety disorder 11/15/2015   Chest pain 08/13/2020   COPD (chronic obstructive pulmonary disease) ([place]) 08/13/2020   Coronary artery disease status post non-STEMI, PTCA and stent to mid LAD in March 2022 09/08/2020   Encounter for long-term current use of high risk medication 11/15/2015   Essential hypertension 11/15/2015   Herpes simplex type II infection 01/31/2017   Hypothyroidism (acquired) 11/15/2015   Malaise and fatigue 11/15/2015   Mixed hyperlipidemia 11/15/2015   NSTEMI (non-ST elevated myocardial infarction) ([place]) 08/13/2020   Peripheral vascular disease ([place]) 11/15/2015    Objective:  Physical Exam: Vascular: DP/PT pulses 2/4 bilateral. CFT <3 seconds. Normal hair growth on digits. No edema.  Skin. No lacerations or abrasions bilateral feet. Incurvation of bilateral border or right great toe. Very sensitive to touch. No erythema edema or purulence noted.  Musculoskeletal: MMT 5/5 bilateral lower extremities in DF, PF, Inversion and Eversion. Deceased ROM in DF of ankle joint.  Neurological: Sensation intact to light touch.   Assessment:   1. Ingrown right greater toenail   2. Pain due to onychomycosis of toenails of both feet      Plan:  Patient was evaluated and treated  and all questions answered. Discussed ingrown toenails etiology and treatment options including procedure for removal vs conservative care with trimming of the nail in slant back fashion. Patient did express confusion about options and not clear how much she understood. Took time to explain all of the options with her today and agreed upon ingrown nail procedure to remove the portions of nails that are painful permanently. Believe this would be the best way to reduce her pain long term and she agrees.  Patient requesting removal of ingrown nail today. Procedure below.  Discussed procedure and post procedure care and patient expressed understanding.  Will follow-up in 2 weeks for nail check or sooner if any problems arise.    Procedure:  Procedure: partial Nail Avulsion of right hallux bilatera nail border.  Surgeon: Lorenda Peck, DPM  Pre-op Dx: Ingrown toenail without infection Post-op: Same  Place of Surgery: Office exam room.  Indications for surgery: Painful and ingrown toenail.    The patient is requesting removal of nail with chemical matrixectomy. Risks and complications were discussed with the patient for which they understand and written consent was obtained. Under sterile conditions a total of 3 mL of  1% lidocaine plain was infiltrated in a hallux block fashion. Once anesthetized, the skin was prepped in sterile fashion. A tourniquet was then applied. Next the bilateral aspect of hallux nail border was then sharply excised making sure to remove the entire offending nail border.  Next phenol was then applied under standard conditions and copiously irrigated. Silvadene  was applied. A dry sterile dressing was applied. After application of the dressing the tourniquet was removed and there is found to be an immediate capillary refill time to the digit. The patient tolerated the procedure well without any complications. Post procedure instructions were discussed the patient for which he  verbally understood. Follow-up in two weeks for nail check or sooner if any problems are to arise. Discussed signs/symptoms of infection and directed to call the office immediately should any occur or go directly to the emergency room. In the meantime, encouraged to call the office with any questions, concerns, changes symptoms.   Lorenda Peck, DPM

## 2022-08-26 DIAGNOSIS — Z780 Asymptomatic menopausal state: Secondary | ICD-10-CM | POA: Diagnosis not present

## 2022-08-26 DIAGNOSIS — I10 Essential (primary) hypertension: Secondary | ICD-10-CM | POA: Diagnosis not present

## 2022-08-26 DIAGNOSIS — E785 Hyperlipidemia, unspecified: Secondary | ICD-10-CM | POA: Diagnosis not present

## 2022-08-26 DIAGNOSIS — E039 Hypothyroidism, unspecified: Secondary | ICD-10-CM | POA: Diagnosis not present

## 2022-09-02 ENCOUNTER — Other Ambulatory Visit: Payer: Self-pay | Admitting: Podiatry

## 2022-09-02 ENCOUNTER — Telehealth: Payer: Self-pay | Admitting: Podiatry

## 2022-09-02 MED ORDER — CEPHALEXIN 500 MG PO CAPS: 500.0000 mg | ORAL_CAPSULE | Freq: Four times a day (QID) | ORAL | 0 refills | Status: DC

## 2022-09-02 NOTE — Telephone Encounter (Signed)
Pt R Great toe is infected and pt is wanting a Antibiotic.   Please advise

## 2022-09-02 NOTE — Telephone Encounter (Signed)
Antibiotics sent to pharmacy for her. Will follow-up as scheduled

## 2022-09-02 NOTE — Telephone Encounter (Signed)
Pt R Great toe is infected and pt wanting a Rx for antibiotics.   Please advise

## 2022-09-09 ENCOUNTER — Other Ambulatory Visit: Payer: Self-pay

## 2022-09-09 MED ORDER — ATORVASTATIN CALCIUM 20 MG PO TABS: 20.0000 mg | ORAL_TABLET | Freq: Every day | ORAL | 0 refills | Status: DC

## 2022-09-11 ENCOUNTER — Ambulatory Visit: Payer: Medicare Other | Admitting: Podiatry

## 2022-09-11 ENCOUNTER — Encounter: Payer: Self-pay | Admitting: Podiatry

## 2022-09-11 DIAGNOSIS — L600 Ingrowing nail: Secondary | ICD-10-CM

## 2022-09-11 MED ORDER — PREGABALIN 150 MG PO CAPS: 150.0000 mg | ORAL_CAPSULE | Freq: Two times a day (BID) | ORAL | 0 refills | Status: DC

## 2022-09-11 MED ORDER — CEPHALEXIN 500 MG PO CAPS: 500.0000 mg | ORAL_CAPSULE | Freq: Four times a day (QID) | ORAL | 0 refills | Status: DC

## 2022-09-11 NOTE — Progress Notes (Signed)
  Subjective:  Patient ID: Kristi Burns, female    DOB: 06/20/1945,   MRN: [id number]  Chief Complaint  Patient presents with   Foot Problem    Possible nail infection, right great hallux, patient stated that the medication was ineffective that was given in the beginning of march    76 y.o. female presents for follow-up of right nail procedure. Relates she has continued to have pain in the toe and concerned for further infection. She has been soaking.  . Denies any other pedal complaints. Denies n/v/f/c.   Past Medical History:  Diagnosis Date   AKI (acute kidney injury) ([place]) 08/13/2020   Anxiety disorder 11/15/2015   Chest pain 08/13/2020   COPD (chronic obstructive pulmonary disease) ([place]) 08/13/2020   Coronary artery disease status post non-STEMI, PTCA and stent to mid LAD in March 2022 09/08/2020   Encounter for long-term current use of high risk medication 11/15/2015   Essential hypertension 11/15/2015   Herpes simplex type II infection 01/31/2017   Hypothyroidism (acquired) 11/15/2015   Malaise and fatigue 11/15/2015   Mixed hyperlipidemia 11/15/2015   NSTEMI (non-ST elevated myocardial infarction) ([place]) 08/13/2020   Peripheral vascular disease ([place]) 11/15/2015    Objective:  Physical Exam: Vascular: DP/PT pulses 2/4 bilateral. CFT <3 seconds. Normal hair growth on digits. No edema.  Skin. No lacerations or abrasions bilateral feet. Right hallux bilateral borders appear to be healing well. Some mild erythema noted to proximal nail fold.  Musculoskeletal: MMT 5/5 bilateral lower extremities in DF, PF, Inversion and Eversion. Deceased ROM in DF of ankle joint. Exquisitley tender to right hallux and proximal up foot.  Neurological: Sensation intact to light touch.   Assessment:   1. Ingrown right greater toenail      Plan:  Patient was evaluated and treated and all questions answered. Toe was evaluated and appears to be healing well. There is some mild erythema typical at this  point but with patients pain will send in another round of antibiotics to be safe.  Lyrica sent in as well as suspect some of this pain is likely nerve related. May discontinue soaks and neosporin.  Patient to follow-up as needed.    Lorenda Peck, DPM

## 2022-10-02 DIAGNOSIS — M4326 Fusion of spine, lumbar region: Secondary | ICD-10-CM | POA: Diagnosis not present

## 2022-10-02 DIAGNOSIS — M4322 Fusion of spine, cervical region: Secondary | ICD-10-CM | POA: Diagnosis not present

## 2022-10-02 DIAGNOSIS — M545 Low back pain, unspecified: Secondary | ICD-10-CM | POA: Diagnosis not present

## 2022-10-02 DIAGNOSIS — M532X6 Spinal instabilities, lumbar region: Secondary | ICD-10-CM | POA: Diagnosis not present

## 2022-10-10 ENCOUNTER — Ambulatory Visit: Payer: Medicare Other | Attending: Cardiology | Admitting: Cardiology

## 2022-10-10 ENCOUNTER — Encounter: Payer: Self-pay | Admitting: Cardiology

## 2022-10-10 VITALS — BP 126/64 | HR 76 | Ht 63.0 in | Wt 155.0 lb

## 2022-10-10 DIAGNOSIS — R0609 Other forms of dyspnea: Secondary | ICD-10-CM

## 2022-10-10 DIAGNOSIS — I10 Essential (primary) hypertension: Secondary | ICD-10-CM | POA: Diagnosis not present

## 2022-10-10 DIAGNOSIS — I2510 Atherosclerotic heart disease of native coronary artery without angina pectoris: Secondary | ICD-10-CM | POA: Diagnosis not present

## 2022-10-10 DIAGNOSIS — E782 Mixed hyperlipidemia: Secondary | ICD-10-CM | POA: Diagnosis not present

## 2022-10-10 DIAGNOSIS — J449 Chronic obstructive pulmonary disease, unspecified: Secondary | ICD-10-CM | POA: Diagnosis not present

## 2022-10-10 MED ORDER — FUROSEMIDE 20 MG PO TABS: 20.0000 mg | ORAL_TABLET | Freq: Every day | ORAL | 3 refills | Status: DC

## 2022-10-10 NOTE — Addendum Note (Signed)
Addended by: Roxanne Mins I on: 10/10/2022 05:00 PM   Modules accepted: Orders

## 2022-10-10 NOTE — Progress Notes (Signed)
Cardiology Office Note:    Date:  10/10/2022   ID:  Kristi Burns, DOB 1946/03/15, MRN [id number]  PCP:  Jim Like, NP  Cardiologist:  Gypsy Balsam, MD    Referring MD: Jim Like, NP   Chief Complaint  Patient presents with   Follow-up  My legs are swollen  History of Present Illness:    Kristi Burns is a 77 y.o. female  with past medical history significant for coronary artery disease history of non-STEMI in February 2022.  She came to [facility], troponin I was minimally elevated she was transferred to [facility], cardiac catheterization has been performed.  Stenting to mid LAD has been performed with drug-eluting stent.  She also had 25% stenosis distally.  Everything else looks good.  Ejection fraction at the time was 50 to 55%.  She did have however intermittent heart block when she was in the hospital.  I put monitor on her to see if she has any recurrences of AV conduction problem surprisingly fine ventricular tachycardia.    Comes today to my office for follow-up.  He is she complain of having some swollen legs however on my physical exam I see very minimal if at all.  Majority of it is right ankle.  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   Hypothyroidism (acquired) 11/15/2015   Malaise and fatigue 11/15/2015   Mixed hyperlipidemia 11/15/2015   NSTEMI (non-ST elevated myocardial infarction) 08/13/2020   Peripheral vascular disease 11/15/2015     Past Surgical History:  Procedure Laterality Date   CORONARY STENT INTERVENTION N/A 08/14/2020   Procedure: CORONARY STENT INTERVENTION;  Surgeon: Corky Crafts, MD;  Location: [facility] INVASIVE CV LAB;  Service: Cardiovascular;  Laterality: N/A;   CORONARY ULTRASOUND/IVUS N/A 08/14/2020   Procedure: Intravascular Ultrasound/IVUS;  Surgeon: Corky Crafts, MD;  Location: [facility] INVASIVE CV LAB;  Service: Cardiovascular;  Laterality: N/A;   LEFT HEART CATH AND CORONARY ANGIOGRAPHY N/A 08/14/2020   Procedure: LEFT HEART CATH AND CORONARY ANGIOGRAPHY;  Surgeon: Corky Crafts, MD;  Location: [facility] INVASIVE CV LAB;  Service: Cardiovascular;  Laterality: N/A;    Current Medications: Current Meds  Medication Sig   acetaminophen (TYLENOL) 500 MG tablet Take 1,000 mg by mouth 2 (two) times daily as needed for mild pain or moderate pain (back pain). Take with ibuprofen   alendronate (FOSAMAX) 35 MG tablet Take 35 mg by mouth every 7 (seven) days. Take with a full glass of water on an empty stomach.   ALPRAZolam (XANAX) 0.5 MG tablet Take 0.5 mg by mouth daily as needed for anxiety.   aspirin 81 MG chewable tablet Chew 1 tablet (81 mg total) by mouth daily.   atorvastatin (LIPITOR) 20 MG tablet Take 1 tablet (20 mg total) by mouth daily.   BIOTIN PO Take 1 tablet by mouth every morning. Unknown strength   Cholecalciferol (VITAMIN D3 PO) Take 1 tablet by mouth every morning. Unknown strength   co-enzyme Q-10 30 MG capsule  Take 100 mg by mouth daily.   Cyanocobalamin (VITAMIN B-12 PO) Take 1 tablet by mouth every morning. Unknown strength   fenoprofen (NALFON) 600 MG TABS tablet Take 600 mg by mouth.   furosemide (LASIX) 20 MG tablet Take 1 tablet (20 mg total) by mouth daily.   levothyroxine (SYNTHROID) 88 MCG tablet Take 50 mcg by mouth daily.   medroxyPROGESTERone (PROVERA) 2.5 MG tablet Take 2.5 mg by mouth every morning.   POTASSIUM PO Take 1 tablet by mouth every morning. Unknown  strenght   pregabalin (LYRICA) 150 MG capsule Take 1 capsule (150 mg total) by mouth 2 (two) times daily.     Allergies:   Other   Social History   Socioeconomic History   Marital status: Widowed    Spouse name: Not on file   Number of children: Not on file   Years of education: Not on file   Highest education level: Not on file  Occupational History   Not on file  Tobacco Use   Smoking status: Never   Smokeless tobacco: Never  Substance and Sexual Activity   Alcohol use: No   Drug use: No   Sexual activity: Not on file  Other Topics Concern   Not on file  Social History Narrative   Not on file   Social Determinants of Health   Financial Resource Strain: Not on file  Food Insecurity: Not on file  Transportation Needs: Not on file  Physical Activity: Not on file  Stress: Not on file  Social Connections: Not on file     Family History: The patient's family history includes Heart attack in her father; Lung cancer in her mother. ROS:   Please see the history of present illness.    All 14 point review of systems negative except as described per history of present illness  EKGs/Labs/Other Studies Reviewed:      Recent Labs: No results found for requested labs within last 365 days.  Recent Lipid Panel    Component Value Date/Time   CHOL 114 09/03/2021 1623   TRIG 97 09/03/2021 1623   HDL 49 09/03/2021 1623   CHOLHDL 2.3 09/03/2021 1623   CHOLHDL 3.0 08/13/2020 1831   VLDL 16 08/13/2020 1831   LDLCALC 47 09/03/2021 1623    Physical Exam:    VS:  BP 126/64 (BP Location: Left Arm, Patient Position: Sitting)   Pulse 76   Ht  (1.6 m)   Wt 155 lb (70.3 kg)   SpO2 93%   BMI 27.46 kg/m     Wt Readings from Last 3 Encounters:  10/10/22 155 lb (70.3 kg)  01/23/22 158 lb 6.4 oz (71.8 kg)  07/26/21 154 lb 9.6 oz (70.1 kg)     GEN:  Well nourished, well developed in no acute distress HEENT: Normal NECK: No JVD; No carotid bruits LYMPHATICS: No  lymphadenopathy CARDIAC: RRR, no murmurs, no rubs, no gallops RESPIRATORY:  Clear to auscultation without rales, wheezing or rhonchi  ABDOMEN: Soft, non-tender, non-distended MUSCULOSKELETAL:  No edema; No deformity  SKIN: Warm and dry LOWER EXTREMITIES: no swelling NEUROLOGIC:  Alert and oriented x 3 PSYCHIATRIC:  Normal affect   ASSESSMENT:    1. Dyspnea on exertion   2. Coronary artery disease involving native coronary artery of native heart without angina pectoris   3. Essential hypertension   4. Chronic obstructive pulmonary disease, unspecified COPD type   5. Mixed hyperlipidemia    PLAN:    In order of problems listed  above:  Swelling of lower extremities with some dyspnea on exertion will schedule her to have echocardiogram.  I will give her small dose of diuretic only 20 mg of Lasix take it on as-needed basis Coronary disease seems to be stable from that point review.  Denies have any chest pain tightness squeezing pressure burning chest. Essential hypertension blood pressure is well-controlled. Dyslipidemia I did review K PN which show me data from March 2024 with LDL of 39 HDL 50.  Will continue present management.   Medication Adjustments/Labs and Tests Ordered: Current medicines are reviewed at length with the patient today.  Concerns regarding medicines are outlined above.  Orders Placed This Encounter  Procedures   ECHOCARDIOGRAM COMPLETE   Medication changes:  Meds ordered this encounter  Medications   furosemide (LASIX) 20 MG tablet    Sig: Take 1 tablet (20 mg total) by mouth daily.    Dispense:  90 tablet    Refill:  3    Signed, Georgeanna Lea, MD, [facility] 10/10/2022 4:26 PM    [facility]

## 2022-10-10 NOTE — Patient Instructions (Addendum)
Medication Instructions:   RESTART: Lasix  1 tablet daily   Lab Work: None Ordered If you have labs (blood work) drawn today and your tests are completely normal, you will receive your results only by: MyChart Message (if you have MyChart) OR A paper copy in the mail If you have any lab test that is abnormal or we need to change your treatment, we will call you to review the results.   Testing/Procedures: Your physician has requested that you have an echocardiogram. Echocardiography is a painless test that uses sound waves to create images of your heart. It provides your doctor with information about the size and shape of your heart and how well your heart's chambers and valves are working. This procedure takes approximately one hour. There are no restrictions for this procedure. Please do NOT wear cologne, perfume, aftershave, or lotions (deodorant is allowed). Please arrive 15 minutes prior to your appointment time.    Follow-Up: At [facility], you and your health needs are our priority.  As part of our continuing mission to provide you with exceptional heart care, we have created designated Provider Care Teams.  These Care Teams include your primary Cardiologist (physician) and Advanced Practice Providers (APPs -  Physician Assistants and Nurse Practitioners) who all work together to provide you with the care you need, when you need it.  We recommend signing up for the patient portal called "MyChart".  Sign up information is provided on this After Visit Summary.  MyChart is used to connect with patients for Virtual Visits (Telemedicine).  Patients are able to view lab/test results, encounter notes, upcoming appointments, etc.  Non-urgent messages can be sent to your provider as well.   To learn more about what you can do with MyChart, go to [email or web address].    Your next appointment:   6 month(s)  The format for your next appointment:   In Person  Provider:   Gypsy Balsam, MD    Other Instructions NA

## 2022-10-24 ENCOUNTER — Ambulatory Visit: Payer: Medicare Other | Attending: Cardiology

## 2022-10-24 DIAGNOSIS — R0609 Other forms of dyspnea: Secondary | ICD-10-CM

## 2022-10-24 LAB — ECHOCARDIOGRAM COMPLETE
Area-P 1/2: 3.56 cm2
S' Lateral: 2.6 cm

## 2022-10-25 ENCOUNTER — Telehealth: Payer: Self-pay

## 2022-10-25 NOTE — Telephone Encounter (Signed)
Pt viewed results in My Chart per Dr. Krasowski's note. Routed to PCP.  

## 2022-10-25 NOTE — Telephone Encounter (Signed)
Left message on My Chart with normal results per Dr. Krasowski's note. Routed to PCP. 

## 2022-11-03 ENCOUNTER — Other Ambulatory Visit: Payer: Self-pay | Admitting: Cardiology

## 2022-11-08 ENCOUNTER — Telehealth: Payer: Self-pay

## 2022-11-08 NOTE — Patient Outreach (Signed)
  Care Coordination   11/08/2022 Name: Kristi Burns MRN: [id number] DOB: [birth year]   Care Coordination Outreach Attempts:  An unsuccessful telephone outreach was attempted today to offer the patient information about available care coordination services.  Follow Up Plan:  Additional outreach attempts will be made to offer the patient care coordination information and services.   Encounter Outcome:  No Answer   Care Coordination Interventions:  No, not indicated    Rowe Pavy, RN, BSN, [facility] [employer] [phone]

## 2022-11-12 DIAGNOSIS — Z79899 Other long term (current) drug therapy: Secondary | ICD-10-CM | POA: Diagnosis not present

## 2022-11-12 DIAGNOSIS — G894 Chronic pain syndrome: Secondary | ICD-10-CM | POA: Diagnosis not present

## 2022-11-12 DIAGNOSIS — Z79891 Long term (current) use of opiate analgesic: Secondary | ICD-10-CM | POA: Diagnosis not present

## 2022-11-12 DIAGNOSIS — M532X6 Spinal instabilities, lumbar region: Secondary | ICD-10-CM | POA: Diagnosis not present

## 2022-11-12 DIAGNOSIS — M4326 Fusion of spine, lumbar region: Secondary | ICD-10-CM | POA: Diagnosis not present

## 2022-11-20 ENCOUNTER — Telehealth: Payer: Self-pay

## 2022-11-20 NOTE — Patient Outreach (Signed)
  Care Coordination   11/20/2022 Name: Kristi Burns MRN: [id number] DOB: 1946/06/03   Care Coordination Outreach Attempts:  A second unsuccessful outreach was attempted today to offer the patient with information about available care coordination services.  Follow Up Plan:  Additional outreach attempts will be made to offer the patient care coordination information and services.   Encounter Outcome:  No Answer   Care Coordination Interventions:  No, not indicated    Rowe Pavy, RN, BSN, [facility] [employer] [phone]

## 2022-11-21 ENCOUNTER — Telehealth: Payer: Self-pay

## 2022-11-21 NOTE — Patient Outreach (Signed)
  Care Coordination   11/21/2022 Name: Kristi Burns MRN: [id number] DOB: [birth year]   Care Coordination Outreach Attempts:  An unsuccessful telephone outreach was attempted today to offer the patient information about available care coordination services.  Follow Up Plan:  No further outreach attempts will be made at this time. We have been unable to contact the patient to offer or enroll patient in care coordination services  Encounter Outcome:  No Answer   Care Coordination Interventions:  No, not indicated    Rowe Pavy, RN, BSN, C[facility] Coordinator [phone]

## 2022-12-26 DIAGNOSIS — E039 Hypothyroidism, unspecified: Secondary | ICD-10-CM | POA: Diagnosis not present

## 2022-12-26 DIAGNOSIS — I10 Essential (primary) hypertension: Secondary | ICD-10-CM | POA: Diagnosis not present

## 2022-12-26 DIAGNOSIS — Z9181 History of falling: Secondary | ICD-10-CM | POA: Diagnosis not present

## 2022-12-26 DIAGNOSIS — Z8619 Personal history of other infectious and parasitic diseases: Secondary | ICD-10-CM | POA: Diagnosis not present

## 2022-12-26 DIAGNOSIS — I252 Old myocardial infarction: Secondary | ICD-10-CM | POA: Diagnosis not present

## 2022-12-26 DIAGNOSIS — Z139 Encounter for screening, unspecified: Secondary | ICD-10-CM | POA: Diagnosis not present

## 2022-12-26 DIAGNOSIS — E785 Hyperlipidemia, unspecified: Secondary | ICD-10-CM | POA: Diagnosis not present

## 2022-12-26 DIAGNOSIS — J45909 Unspecified asthma, uncomplicated: Secondary | ICD-10-CM | POA: Diagnosis not present

## 2023-01-13 DIAGNOSIS — M5412 Radiculopathy, cervical region: Secondary | ICD-10-CM | POA: Diagnosis not present

## 2023-01-13 DIAGNOSIS — M4326 Fusion of spine, lumbar region: Secondary | ICD-10-CM | POA: Diagnosis not present

## 2023-01-13 DIAGNOSIS — M4322 Fusion of spine, cervical region: Secondary | ICD-10-CM | POA: Diagnosis not present

## 2023-02-06 DIAGNOSIS — M549 Dorsalgia, unspecified: Secondary | ICD-10-CM | POA: Diagnosis not present

## 2023-02-06 DIAGNOSIS — G8929 Other chronic pain: Secondary | ICD-10-CM | POA: Diagnosis not present

## 2023-02-06 DIAGNOSIS — Z780 Asymptomatic menopausal state: Secondary | ICD-10-CM | POA: Diagnosis not present

## 2023-02-06 DIAGNOSIS — E039 Hypothyroidism, unspecified: Secondary | ICD-10-CM | POA: Diagnosis not present

## 2023-02-20 DIAGNOSIS — J069 Acute upper respiratory infection, unspecified: Secondary | ICD-10-CM | POA: Diagnosis not present

## 2023-02-20 DIAGNOSIS — U071 COVID-19: Secondary | ICD-10-CM | POA: Diagnosis not present

## 2023-04-28 DIAGNOSIS — M810 Age-related osteoporosis without current pathological fracture: Secondary | ICD-10-CM | POA: Diagnosis not present

## 2023-04-28 DIAGNOSIS — E039 Hypothyroidism, unspecified: Secondary | ICD-10-CM | POA: Diagnosis not present

## 2023-04-28 DIAGNOSIS — I10 Essential (primary) hypertension: Secondary | ICD-10-CM | POA: Diagnosis not present

## 2023-04-28 DIAGNOSIS — E785 Hyperlipidemia, unspecified: Secondary | ICD-10-CM | POA: Diagnosis not present

## 2023-04-28 DIAGNOSIS — I252 Old myocardial infarction: Secondary | ICD-10-CM | POA: Diagnosis not present

## 2023-04-28 DIAGNOSIS — L989 Disorder of the skin and subcutaneous tissue, unspecified: Secondary | ICD-10-CM | POA: Diagnosis not present

## 2023-05-06 ENCOUNTER — Telehealth: Payer: Self-pay | Admitting: Cardiology

## 2023-05-06 NOTE — Telephone Encounter (Signed)
Spoke with pt she stated that she had a spell of shortness of breath and weakness this morning. She would like a sooner appt than January if possible. Sent to front desk for appt.

## 2023-05-06 NOTE — Telephone Encounter (Signed)
Pt c/o Shortness Of Breath: STAT if SOB developed within the last 24 hours or pt is noticeably SOB on the phone  1. Are you currently SOB (can you hear that pt is SOB on the phone)? no  2. How long have you been experiencing SOB? This morning  3. Are you SOB when sitting or when up moving around? Moving around   4. Are you currently experiencing any other symptoms? Clammy skin, legs felt weak, stomach felt weak  Pt woke up this morning feeling like this said it lasted for like 2 hours. She said this is the same way she was feeling before they put a stent in.

## 2023-05-10 ENCOUNTER — Other Ambulatory Visit: Payer: Self-pay | Admitting: Cardiology

## 2023-06-06 DIAGNOSIS — R051 Acute cough: Secondary | ICD-10-CM | POA: Diagnosis not present

## 2023-06-06 DIAGNOSIS — J069 Acute upper respiratory infection, unspecified: Secondary | ICD-10-CM | POA: Diagnosis not present

## 2023-06-06 DIAGNOSIS — R0981 Nasal congestion: Secondary | ICD-10-CM | POA: Diagnosis not present

## 2023-06-19 DIAGNOSIS — E039 Hypothyroidism, unspecified: Secondary | ICD-10-CM | POA: Diagnosis not present

## 2023-06-24 ENCOUNTER — Other Ambulatory Visit: Payer: Self-pay | Admitting: Cardiology

## 2023-06-26 ENCOUNTER — Ambulatory Visit: Payer: Medicare Other | Attending: Cardiology | Admitting: Cardiology

## 2023-06-26 VITALS — BP 116/60 | HR 85 | Ht 63.0 in | Wt 156.2 lb

## 2023-06-26 DIAGNOSIS — I2510 Atherosclerotic heart disease of native coronary artery without angina pectoris: Secondary | ICD-10-CM | POA: Diagnosis not present

## 2023-06-26 DIAGNOSIS — E782 Mixed hyperlipidemia: Secondary | ICD-10-CM | POA: Diagnosis not present

## 2023-06-26 DIAGNOSIS — I10 Essential (primary) hypertension: Secondary | ICD-10-CM

## 2023-06-26 NOTE — Patient Instructions (Signed)
 Medication Instructions:  Your physician recommends that you continue on your current medications as directed. Please refer to the Current Medication list given to you today.   *If you need a refill on your cardiac medications before your next appointment, please call your pharmacy*   Lab Work: None If you have labs (blood work) drawn today and your tests are completely normal, you will receive your results only by: MyChart Message (if you have MyChart) OR A paper copy in the mail If you have any lab test that is abnormal or we need to change your treatment, we will call you to review the results.   Testing/Procedures: None   Follow-Up: At [facility], you and your health needs are our priority.  As part of our continuing mission to provide you with exceptional heart care, we have created designated Provider Care Teams.  These Care Teams include your primary Cardiologist (physician) and Advanced Practice Providers (APPs -  Physician Assistants and Nurse Practitioners) who all work together to provide you with the care you need, when you need it.  We recommend signing up for the patient portal called "MyChart".  Sign up information is provided on this After Visit Summary.  MyChart is used to connect with patients for Virtual Visits (Telemedicine).  Patients are able to view lab/test results, encounter notes, upcoming appointments, etc.  Non-urgent messages can be sent to your provider as well.   To learn more about what you can do with MyChart, go to [email or web address].    Your next appointment:   1 year(s)  Provider:   Gypsy Balsam, MD    Other Instructions

## 2023-06-26 NOTE — Progress Notes (Signed)
 Cardiology Office Note:    Date:  06/26/2023   ID:  Kristi Burns, DOB 1946/05/24, MRN [id number]  PCP:  Pandora Therisa RAMAN, NP  Cardiologist:  Lamar Fitch, MD    Referring MD: Pandora Therisa RAMAN, NP   Chief Complaint  Patient presents with   Follow-up    History of Present Illness:    Kristi Burns is a 78 y.o. female past medical history significant for coronary disease she did have non-STEMI in February 2022 she came to the hospital troponin elevated transferred to [facility] cardiac cath required stenting to mid LAD that being performed with drug-eluting stent.  Also had 25% stenosed distally, ejection fraction lower limits of normal.  There was also some issue with AV conduction after that monitor was placed negative.  Comes today to my office follow-up doing well denies of any problem, no chest pain tightness squeezing pressure burning chest no palpitation dizziness swelling of lower extremities.  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   Hypothyroidism (acquired) 11/15/2015   Malaise and fatigue 11/15/2015   Mixed hyperlipidemia 11/15/2015   NSTEMI (non-ST elevated myocardial infarction) (HCC) 08/13/2020   Peripheral vascular disease (HCC) 11/15/2015    Past Surgical History:  Procedure Laterality Date   CORONARY STENT INTERVENTION N/A 08/14/2020   Procedure: CORONARY STENT INTERVENTION;  Surgeon: Dann Candyce RAMAN, MD;  Location: [facility] INVASIVE CV LAB;  Service: Cardiovascular;   Laterality: N/A;   CORONARY ULTRASOUND/IVUS N/A 08/14/2020   Procedure: Intravascular Ultrasound/IVUS;  Surgeon: Dann Candyce RAMAN, MD;  Location: [facility] INVASIVE CV LAB;  Service: Cardiovascular;  Laterality: N/A;   LEFT HEART CATH AND CORONARY ANGIOGRAPHY N/A 08/14/2020   Procedure: LEFT HEART CATH AND CORONARY ANGIOGRAPHY;  Surgeon: Dann Candyce RAMAN, MD;  Location: [facility] INVASIVE CV LAB;  Service: Cardiovascular;  Laterality: N/A;    Current Medications: Current Meds  Medication Sig   acetaminophen  (TYLENOL ) 500 MG tablet Take 1,000 mg by mouth 2 (two) times daily as needed for mild pain or moderate pain (back pain). Take with ibuprofen    alendronate (FOSAMAX) 35 MG tablet Take 35 mg by mouth every 7 (seven) days. Take with a full glass of water on an empty stomach.   ALPRAZolam  (XANAX ) 0.5 MG tablet Take 0.5 mg by mouth daily as needed for anxiety.   aspirin  81 MG chewable tablet Chew 1 tablet (81 mg total) by mouth daily.   atorvastatin  (LIPITOR ) 20 MG tablet Take 1 tablet (20 mg total) by mouth daily.   azithromycin (ZITHROMAX) 250 MG tablet Take 250 mg by mouth daily.   BIOTIN PO Take 1 tablet by mouth every morning. Unknown strength   Cholecalciferol (VITAMIN D3 PO) Take 1 tablet by mouth every morning. Unknown strength   co-enzyme Q-10 30 MG capsule Take 100 mg by mouth daily.   Cyanocobalamin  (VITAMIN B-12 PO) Take 1 tablet by mouth every morning. Unknown strength   fenoprofen (NALFON) 600 MG TABS tablet Take 600 mg by mouth every 6 (six) hours as needed.  furosemide  (LASIX ) 20 MG tablet Take 1 tablet (20 mg total) by mouth daily.   hydrochlorothiazide (HYDRODIURIL) 25 MG tablet Take 1 tablet by mouth daily.   levothyroxine  (SYNTHROID ) 88 MCG tablet Take 50 mcg by mouth daily.   lisinopril  (ZESTRIL ) 10 MG tablet Take 1 tablet (10 mg total) by mouth daily.   medroxyPROGESTERone (PROVERA) 2.5 MG tablet Take 2.5 mg by mouth every morning.   metoprolol  succinate (TOPROL -XL) 25 MG 24 hr  tablet Take 1 tablet (25 mg total) by mouth daily.   POTASSIUM PO Take 1 tablet by mouth every morning. Unknown strenght   pregabalin  (LYRICA ) 150 MG capsule Take 1 capsule (150 mg total) by mouth 2 (two) times daily.     Allergies:   Patient has no active allergies.   Social History   Socioeconomic History   Marital status: Widowed    Spouse name: Not on file   Number of children: Not on file   Years of education: Not on file   Highest education level: Not on file  Occupational History   Not on file  Tobacco Use   Smoking status: Never   Smokeless tobacco: Never  Substance and Sexual Activity   Alcohol use: No   Drug use: No   Sexual activity: Not on file  Other Topics Concern   Not on file  Social History Narrative   Not on file   Social Drivers of Health   Financial Resource Strain: Not on file  Food Insecurity: Not on file  Transportation Needs: Not on file  Physical Activity: Not on file  Stress: Not on file  Social Connections: Not on file     Family History: The patient's family history includes Heart attack in her father; Lung cancer in her mother. ROS:   Please see the history of present illness.    All 14 point review of systems negative except as described per history of present illness  EKGs/Labs/Other Studies Reviewed:         Recent Labs: No results found for requested labs within last 365 days.  Recent Lipid Panel    Component Value Date/Time   CHOL 114 09/03/2021 1623   TRIG 97 09/03/2021 1623   HDL 49 09/03/2021 1623   CHOLHDL 2.3 09/03/2021 1623   CHOLHDL 3.0 08/13/2020 1831   VLDL 16 08/13/2020 1831   LDLCALC 47 09/03/2021 1623    Physical Exam:    VS:  BP 116/60 (BP Location: Left Arm, Patient Position: Sitting)   Pulse 85   Ht 5' 3 (1.6 m)   Wt 156 lb 3.2 oz (70.9 kg)   SpO2 98%   BMI 27.67 kg/m     Wt Readings from Last 3 Encounters:  06/26/23 156 lb 3.2 oz (70.9 kg)  10/10/22 155 lb (70.3 kg)  01/23/22 158 lb 6.4 oz  (71.8 kg)     GEN:  Well nourished, well developed in no acute distress HEENT: Normal NECK: No JVD; No carotid bruits LYMPHATICS: No lymphadenopathy CARDIAC: RRR, no murmurs, no rubs, no gallops RESPIRATORY:  Clear to auscultation without rales, wheezing or rhonchi  ABDOMEN: Soft, non-tender, non-distended MUSCULOSKELETAL:  No edema; No deformity  SKIN: Warm and dry LOWER EXTREMITIES: no swelling NEUROLOGIC:  Alert and oriented x 3 PSYCHIATRIC:  Normal affect   ASSESSMENT:    1. Coronary artery disease involving native coronary artery of native heart without angina pectoris   2. Essential hypertension   3. Mixed hyperlipidemia    PLAN:  In order of problems listed above:  Coronary disease stable on guideline directed medical therapy which we will continue. Essential hypertension blood pressure well-controlled continue present management. Dyslipidemia I did review K PN LDL 46 HDL 54 from November of last year excellent control Lipitor  20 we will continue. Swelling of lower extremities denies having any   Medication Adjustments/Labs and Tests Ordered: Current medicines are reviewed at length with the patient today.  Concerns regarding medicines are outlined above.  No orders of the defined types were placed in this encounter.  Medication changes: No orders of the defined types were placed in this encounter.   Signed, Lamar DOROTHA Fitch, MD, [facility] 06/26/2023 3:17 PM    [facility]

## 2023-07-28 DIAGNOSIS — R5383 Other fatigue: Secondary | ICD-10-CM | POA: Diagnosis not present

## 2023-07-28 DIAGNOSIS — E039 Hypothyroidism, unspecified: Secondary | ICD-10-CM | POA: Diagnosis not present

## 2023-08-11 DIAGNOSIS — E039 Hypothyroidism, unspecified: Secondary | ICD-10-CM | POA: Diagnosis not present

## 2023-08-13 DIAGNOSIS — Z9181 History of falling: Secondary | ICD-10-CM | POA: Diagnosis not present

## 2023-08-13 DIAGNOSIS — Z0000 Encounter for general adult medical examination without abnormal findings: Secondary | ICD-10-CM | POA: Diagnosis not present

## 2023-08-28 DIAGNOSIS — I252 Old myocardial infarction: Secondary | ICD-10-CM | POA: Diagnosis not present

## 2023-08-28 DIAGNOSIS — E785 Hyperlipidemia, unspecified: Secondary | ICD-10-CM | POA: Diagnosis not present

## 2023-08-28 DIAGNOSIS — M810 Age-related osteoporosis without current pathological fracture: Secondary | ICD-10-CM | POA: Diagnosis not present

## 2023-08-28 DIAGNOSIS — I10 Essential (primary) hypertension: Secondary | ICD-10-CM | POA: Diagnosis not present

## 2023-08-28 DIAGNOSIS — E039 Hypothyroidism, unspecified: Secondary | ICD-10-CM | POA: Diagnosis not present

## 2023-08-29 ENCOUNTER — Ambulatory Visit: Payer: Medicare Other | Admitting: Orthopedic Surgery

## 2023-09-11 DIAGNOSIS — Z1231 Encounter for screening mammogram for malignant neoplasm of breast: Secondary | ICD-10-CM | POA: Diagnosis not present

## 2023-09-20 ENCOUNTER — Other Ambulatory Visit: Payer: Self-pay | Admitting: Cardiology

## 2023-09-24 DIAGNOSIS — M5442 Lumbago with sciatica, left side: Secondary | ICD-10-CM | POA: Diagnosis not present

## 2023-09-24 DIAGNOSIS — R7301 Impaired fasting glucose: Secondary | ICD-10-CM | POA: Diagnosis not present

## 2023-09-24 DIAGNOSIS — E039 Hypothyroidism, unspecified: Secondary | ICD-10-CM | POA: Diagnosis not present

## 2023-09-24 DIAGNOSIS — M5441 Lumbago with sciatica, right side: Secondary | ICD-10-CM | POA: Diagnosis not present

## 2023-09-24 DIAGNOSIS — G8929 Other chronic pain: Secondary | ICD-10-CM | POA: Diagnosis not present

## 2023-09-24 DIAGNOSIS — N289 Disorder of kidney and ureter, unspecified: Secondary | ICD-10-CM | POA: Diagnosis not present

## 2023-10-03 DIAGNOSIS — N289 Disorder of kidney and ureter, unspecified: Secondary | ICD-10-CM | POA: Diagnosis not present

## 2023-10-15 DIAGNOSIS — N289 Disorder of kidney and ureter, unspecified: Secondary | ICD-10-CM | POA: Diagnosis not present

## 2023-10-28 DIAGNOSIS — Z1211 Encounter for screening for malignant neoplasm of colon: Secondary | ICD-10-CM | POA: Diagnosis not present

## 2023-10-28 DIAGNOSIS — Z1212 Encounter for screening for malignant neoplasm of rectum: Secondary | ICD-10-CM | POA: Diagnosis not present

## 2023-11-03 DIAGNOSIS — E876 Hypokalemia: Secondary | ICD-10-CM | POA: Diagnosis not present

## 2023-11-05 LAB — COLOGUARD: COLOGUARD: POSITIVE — AB

## 2023-11-15 DIAGNOSIS — I10 Essential (primary) hypertension: Secondary | ICD-10-CM | POA: Diagnosis not present

## 2023-12-15 DIAGNOSIS — E785 Hyperlipidemia, unspecified: Secondary | ICD-10-CM | POA: Diagnosis not present

## 2023-12-15 DIAGNOSIS — M5441 Lumbago with sciatica, right side: Secondary | ICD-10-CM | POA: Diagnosis not present

## 2023-12-15 DIAGNOSIS — E039 Hypothyroidism, unspecified: Secondary | ICD-10-CM | POA: Diagnosis not present

## 2023-12-15 DIAGNOSIS — I10 Essential (primary) hypertension: Secondary | ICD-10-CM | POA: Diagnosis not present

## 2024-01-01 ENCOUNTER — Ambulatory Visit: Admitting: Physician Assistant

## 2024-01-01 ENCOUNTER — Other Ambulatory Visit

## 2024-01-01 ENCOUNTER — Encounter: Payer: Self-pay | Admitting: Physician Assistant

## 2024-01-01 ENCOUNTER — Other Ambulatory Visit: Payer: Self-pay | Admitting: *Deleted

## 2024-01-01 ENCOUNTER — Ambulatory Visit: Payer: Self-pay | Admitting: Physician Assistant

## 2024-01-01 VITALS — BP 118/60 | HR 76 | Ht 63.0 in | Wt 138.2 lb

## 2024-01-01 DIAGNOSIS — R195 Other fecal abnormalities: Secondary | ICD-10-CM

## 2024-01-01 DIAGNOSIS — K5900 Constipation, unspecified: Secondary | ICD-10-CM

## 2024-01-01 DIAGNOSIS — I252 Old myocardial infarction: Secondary | ICD-10-CM

## 2024-01-01 DIAGNOSIS — D649 Anemia, unspecified: Secondary | ICD-10-CM | POA: Diagnosis not present

## 2024-01-01 LAB — CBC WITH DIFFERENTIAL/PLATELET
Basophils Absolute: 0.1 K/uL (ref 0.0–0.1)
Basophils Relative: 1.2 % (ref 0.0–3.0)
Eosinophils Absolute: 0.1 K/uL (ref 0.0–0.7)
Eosinophils Relative: 1.7 % (ref 0.0–5.0)
HCT: 35.6 % — ABNORMAL LOW (ref 36.0–46.0)
Hemoglobin: 11.9 g/dL — ABNORMAL LOW (ref 12.0–15.0)
Lymphocytes Relative: 35.8 % (ref 12.0–46.0)
Lymphs Abs: 3.1 K/uL (ref 0.7–4.0)
MCHC: 33.6 g/dL (ref 30.0–36.0)
MCV: 91.4 fl (ref 78.0–100.0)
Monocytes Absolute: 0.9 K/uL (ref 0.1–1.0)
Monocytes Relative: 10 % (ref 3.0–12.0)
Neutro Abs: 4.4 K/uL (ref 1.4–7.7)
Neutrophils Relative %: 51.3 % (ref 43.0–77.0)
Platelets: 223 K/uL (ref 150.0–400.0)
RBC: 3.89 Mil/uL (ref 3.87–5.11)
RDW: 15.7 % — ABNORMAL HIGH (ref 11.5–15.5)
WBC: 8.6 K/uL (ref 4.0–10.5)

## 2024-01-01 LAB — COMPREHENSIVE METABOLIC PANEL WITH GFR
ALT: 5 U/L (ref 0–35)
AST: 16 U/L (ref 0–37)
Albumin: 4.4 g/dL (ref 3.5–5.2)
Alkaline Phosphatase: 72 U/L (ref 39–117)
BUN: 18 mg/dL (ref 6–23)
CO2: 27 meq/L (ref 19–32)
Calcium: 10.5 mg/dL (ref 8.4–10.5)
Chloride: 103 meq/L (ref 96–112)
Creatinine, Ser: 1.13 mg/dL (ref 0.40–1.20)
GFR: 47.16 mL/min — ABNORMAL LOW (ref >60.00)
Glucose, Bld: 100 mg/dL — ABNORMAL HIGH (ref 70–99)
Potassium: 3.3 meq/L — ABNORMAL LOW (ref 3.5–5.1)
Sodium: 139 meq/L (ref 135–145)
Total Bilirubin: 0.6 mg/dL (ref 0.2–1.2)
Total Protein: 7.5 g/dL (ref 6.0–8.3)

## 2024-01-01 LAB — IBC + FERRITIN
Ferritin: 71.8 ng/mL (ref 10.0–291.0)
Iron: 78 ug/dL (ref 42–145)
Saturation Ratios: 25.8 % (ref 20.0–50.0)
TIBC: 302.4 ug/dL (ref 250.0–450.0)
Transferrin: 216 mg/dL (ref 212.0–360.0)

## 2024-01-01 MED ORDER — POTASSIUM CHLORIDE ER 20 MEQ PO TBCR: 40.0000 meq | EXTENDED_RELEASE_TABLET | Freq: Every day | ORAL | 0 refills | Status: AC

## 2024-01-01 NOTE — Patient Instructions (Signed)
 Your provider has requested that you go to the basement level for lab work before leaving today. Press B on the elevator. The lab is located at the first door on the left as you exit the elevator.  _______________________________________________________  If your blood pressure at your visit was 140/90 or greater, please contact your primary care physician to follow up on this.  _______________________________________________________  If you are age 77 or older, your body mass index should be between 23-30. Your Body mass index is 24.48 kg/m. If this is out of the aforementioned range listed, please consider follow up with your Primary Care Provider.  If you are age 25 or younger, your body mass index should be between 19-25. Your Body mass index is 24.48 kg/m. If this is out of the aformentioned range listed, please consider follow up with your Primary Care Provider.   ________________________________________________________  The [facility] providers would like to encourage you to use MYCHART to communicate with providers for non-urgent requests or questions.  Due to long hold times on the telephone, sending your provider a message by [facility] may be a faster and more efficient way to get a response.  Please allow 48 business hours for a response.  Please remember that this is for non-urgent requests.  _______________________________________________________

## 2024-01-01 NOTE — Progress Notes (Signed)
 Please let patient know that her labs do not show an iron deficiency anemia, she is slightly anemic but this looks stable for her over the past 3 years and it does not show microcytic anemia, so likely is from another cause.Her potassium is slightly low, I would like her to take 40 mill equivalents of KCl 1 tablet once daily for 2 days, please send a prescription for her #2 with 0 refills.  She will then need a recheck with us  prior to procedure.  Probably a week out.  Lets get her scheduled for colonoscopy.  She will need a 2-day bowel prep and again A&D ointment.  Also Reglan 10 mg 1 tab 20 minutes before first half of prep and repeat for second #2 with no refill.  Thanks, JL L

## 2024-01-01 NOTE — Progress Notes (Addendum)
 Chief Complaint: Positive Cologuard  HPI:    Kristi Burns is a 78 year old female with a past medical history as listed below including COPD, CAD status post NSTEMI in March 2022, status post DES on Aspirin  and others below who was referred to me by Pandora Therisa RAMAN, NP for a complaint of positive Cologuard.      03/20/22 CBC with a hemoglobin of 8.9, MCV normal.  (We do not have any other labs    10/28/2023 positive Cologuard    Today, patient presents to clinic for her positive Cologuard test.  She tells me she thinks is from eating trail mix that tore me up a little.  She has never had a colonoscopy before, she apparently attempted to do one done in [place] years ago but after doing a half of the prep her rectum was burning, so she could not do it anymore.  She is aware that she is due one now.  She also has a vague history of possible iron deficiency anemia but again we do not have those labs at time of her visit.    Also discusses some constipation ever since starting Cymbalta a few months ago, describes small hard balls and straining.  Has not tried anything over-the-counter for this.    Denies fever, chills, heartburn, reflux, weight loss or abdominal pain.  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   Hypothyroidism (acquired) 11/15/2015   Malaise and fatigue 11/15/2015   Mixed hyperlipidemia 11/15/2015   NSTEMI (non-ST elevated myocardial infarction) (HCC) 08/13/2020   Peripheral vascular disease (HCC) 11/15/2015    Past Surgical History:  Procedure Laterality Date   CORONARY STENT  INTERVENTION N/A 08/14/2020   Procedure: CORONARY STENT INTERVENTION;  Surgeon: Dann Candyce RAMAN, MD;  Location: [facility] INVASIVE CV LAB;  Service: Cardiovascular;  Laterality: N/A;   CORONARY ULTRASOUND/IVUS N/A 08/14/2020   Procedure: Intravascular Ultrasound/IVUS;  Surgeon: Dann Candyce RAMAN, MD;  Location: [facility] INVASIVE CV LAB;  Service: Cardiovascular;  Laterality: N/A;   LEFT HEART CATH AND CORONARY ANGIOGRAPHY N/A 08/14/2020   Procedure: LEFT HEART CATH AND CORONARY ANGIOGRAPHY;  Surgeon: Dann Candyce RAMAN, MD;  Location: [facility] INVASIVE CV LAB;  Service: Cardiovascular;  Laterality: N/A;    Current Outpatient Medications  Medication Sig Dispense Refill   ALPRAZolam  (XANAX ) 0.5 MG tablet Take 0.5 mg by mouth daily as needed for anxiety.     aspirin  81 MG chewable tablet Chew 1 tablet (81 mg total) by mouth daily. 90 tablet 1   atorvastatin  (LIPITOR ) 20 MG tablet TAKE 1 TABLET BY MOUTH DAILY 90 tablet 3   BIOTIN PO Take 1 tablet by mouth every morning. Unknown strength     Cholecalciferol (VITAMIN D3 PO) Take 1 tablet by mouth every morning. Unknown strength     Cyanocobalamin  (VITAMIN B-12 PO) Take 1 tablet by mouth every morning. Unknown strength     DULoxetine (CYMBALTA) 30 MG capsule Take 30 mg by mouth daily.     fenoprofen (NALFON) 600 MG TABS tablet Take 600 mg by mouth every 6 (six) hours as needed.     hydrochlorothiazide (  HYDRODIURIL) 25 MG tablet Take 1 tablet by mouth daily.     levothyroxine  (SYNTHROID ) 88 MCG tablet Take 50 mcg by mouth daily.     medroxyPROGESTERone (PROVERA) 2.5 MG tablet Take 2.5 mg by mouth every morning.     POTASSIUM PO Take 1 tablet by mouth every morning. Unknown strenght     No current facility-administered medications for this visit.    Allergies as of 01/01/2024   (No Known Allergies)    Family History  Problem Relation Age of Onset   Lung cancer Mother    Heart attack Father     Social History   Socioeconomic History   Marital  status: Widowed    Spouse name: Not on file   Number of children: Not on file   Years of education: Not on file   Highest education level: Not on file  Occupational History   Not on file  Tobacco Use   Smoking status: Never   Smokeless tobacco: Never  Substance and Sexual Activity   Alcohol use: No   Drug use: No   Sexual activity: Not on file  Other Topics Concern   Not on file  Social History Narrative   Not on file   Social Drivers of Health   Financial Resource Strain: Not on file  Food Insecurity: Low Risk  (07/28/2023)   Received from [facility]   Hunger Vital Sign    Within the past 12 months, you worried that your food would run out before you got money to buy more: Never true    Within the past 12 months, the food you bought just didn't last and you didn't have money to get more. : Never true  Transportation Needs: No Transportation Needs (07/28/2023)   Received from [employer]    In the past 12 months, has lack of reliable transportation kept you from medical appointments, meetings, work or from getting things needed for daily living? : No  Physical Activity: Not on file  Stress: Not on file  Social Connections: Not on file  Intimate Partner Violence: Not on file    Review of Systems:    Constitutional: No weight loss, fever or chills Skin: No rash  Cardiovascular: No chest pain  Respiratory: No SOB  Gastrointestinal: See HPI and otherwise negative Genitourinary: No dysuria  Neurological: No headache, dizziness or syncope Musculoskeletal: No new muscle or joint pain Hematologic: No bleeding  Psychiatric: No history of depression or anxiety   Physical Exam:  Vital signs: BP 118/60   Pulse 76   Ht 5' 3 (1.6 m)   Wt 138 lb 3.2 oz (62.7 kg)   BMI 24.48 kg/m    Constitutional:   Pleasant elderly Caucasian female appears to be in NAD, Well developed, Well nourished, alert and cooperative Head:  Normocephalic and atraumatic. Eyes:    PEERL, EOMI. No icterus. Conjunctiva pink. Ears:  Normal auditory acuity. Neck:  Supple Throat: Oral cavity and pharynx without inflammation, swelling or lesion.  Respiratory: Respirations even and unlabored. Lungs clear to auscultation bilaterally.   No wheezes, crackles, or rhonchi.  Cardiovascular: Normal S1, S2. No MRG. Regular rate and rhythm. No peripheral edema, cyanosis or pallor.  Gastrointestinal:  Soft, nondistended, nontender. No rebound or guarding. Normal bowel sounds. No appreciable masses or hepatomegaly. Rectal:  Not performed.  Msk:  Symmetrical without gross deformities. Without edema, no deformity or joint abnormality.  Neurologic:  Alert and  oriented x4;  grossly normal neurologically.  Skin:  Dry and intact without significant lesions or rashes. Psychiatric: Demonstrates good judgement and reason without abnormal affect or behaviors.  Requesting recent labs.    Assessment: 1.  Positive Cologuard: Found in May with no prior colon cancer screening 2.  Anemia?:  Last labs we have show hemoglobin low around 8 and patient has a vague history of iron deficiency anemia, if this is the case would benefit from an EGD with her colonoscopy, we are requesting records today and redoing labs 3.  History of NSTEMI in 2022 status post stent: Now on aspirin , recent echo 10/24/2022 with LVEF 60-65% grade 1 diastolic dysfunction 4.  Constipation: Patient describes small hard balls and straining ever since starting Cymbalta over the past couple of months, has not really tried anything yet; most likely medication side effect  Plan: 1.  We discussed today what a Cologuard is actually testing for her.  Answered her questions.  The recommendations are for a colonoscopy now as she most likely has a polyp of some degree growing in her colon.  She verbalized understanding. 2.  We further discussed that her last hemoglobin that I can see is quite low, she cannot remember if she was told she had  iron deficiency or not, not currently on an iron supplement.  If she was iron deficient would recommend an EGD with her colonoscopy.  Therefore we are going to repeat some labs today.  If she is truly iron deficient and her hemoglobin remains low then would recommend we do an EGD and colonoscopy in the [facility].  If not then can proceed with colonoscopy which we discussed in detail today. 3.  Would recommend the patient have a 2-day bowel prep given some constipation complaints lately after starting Cymbalta as well as using A&E ointment on her rectum prior to starting the prep to help her with rectal irritation as experienced at last colonoscopy.  Would also recommend Reglan 10 mg 2 tabs, 1 tab 20 to 30 minutes before each half of prep #2 with no refill. 4.  Recommend the patient start MiraLAX daily 5.  Ordered CBC, CMP and iron studies today with ferritin 6.  We will call the patient to schedule procedures as indicated after labs result today.  She was assigned to Dr. Shila.  Delon Failing, PA-C [facility] 01/01/2024, 11:53 AM  Cc: Pandora Therisa RAMAN, NP

## 2024-01-02 ENCOUNTER — Other Ambulatory Visit: Payer: Self-pay | Admitting: *Deleted

## 2024-01-02 ENCOUNTER — Encounter: Payer: Self-pay | Admitting: *Deleted

## 2024-01-02 ENCOUNTER — Encounter: Payer: Self-pay | Admitting: Gastroenterology

## 2024-01-02 DIAGNOSIS — R195 Other fecal abnormalities: Secondary | ICD-10-CM

## 2024-01-02 DIAGNOSIS — D649 Anemia, unspecified: Secondary | ICD-10-CM

## 2024-01-02 MED ORDER — METOCLOPRAMIDE HCL 10 MG PO TABS: ORAL_TABLET | ORAL | 0 refills | Status: AC

## 2024-01-02 MED ORDER — NA SULFATE-K SULFATE-MG SULF 17.5-3.13-1.6 GM/177ML PO SOLN: 1.0000 | Freq: Once | ORAL | 0 refills | Status: AC

## 2024-01-15 ENCOUNTER — Ambulatory Visit: Admitting: Gastroenterology

## 2024-01-15 ENCOUNTER — Encounter: Payer: Self-pay | Admitting: Gastroenterology

## 2024-01-15 VITALS — BP 125/60 | HR 76 | Temp 97.9°F | Resp 23 | Ht 63.0 in | Wt 138.0 lb

## 2024-01-15 DIAGNOSIS — K644 Residual hemorrhoidal skin tags: Secondary | ICD-10-CM | POA: Diagnosis not present

## 2024-01-15 DIAGNOSIS — K5730 Diverticulosis of large intestine without perforation or abscess without bleeding: Secondary | ICD-10-CM | POA: Diagnosis not present

## 2024-01-15 DIAGNOSIS — R195 Other fecal abnormalities: Secondary | ICD-10-CM

## 2024-01-15 DIAGNOSIS — K648 Other hemorrhoids: Secondary | ICD-10-CM

## 2024-01-15 DIAGNOSIS — Z1211 Encounter for screening for malignant neoplasm of colon: Secondary | ICD-10-CM | POA: Diagnosis not present

## 2024-01-15 MED ORDER — SODIUM CHLORIDE 0.9 % IV SOLN: 500.0000 mL | Freq: Once | INTRAVENOUS | Status: DC

## 2024-01-15 NOTE — Progress Notes (Unsigned)
 [facility] History and Physical   Primary Care Physician:  Pandora Therisa RAMAN, NP   Reason for Procedure:  Positive cologaurd  Plan:    colonoscopy with possible interventions as needed     HPI: Kristi Burns is a very pleasant 78 y.o. female here for colonoscopy for positive Cologuard.   The risks and benefits as well as alternatives of endoscopic procedure(s) have been discussed and reviewed. All questions answered. The patient agrees to proceed.    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   Hypothyroidism (acquired) 11/15/2015   Malaise and fatigue 11/15/2015   Mixed hyperlipidemia 11/15/2015   NSTEMI (non-ST elevated myocardial infarction) (HCC) 08/13/2020   Peripheral vascular disease (HCC) 11/15/2015    Past Surgical History:  Procedure Laterality Date   CORONARY STENT INTERVENTION N/A 08/14/2020   Procedure: CORONARY STENT INTERVENTION;  Surgeon: Dann Candyce RAMAN, MD;  Location: [facility] INVASIVE CV LAB;  Service: Cardiovascular;  Laterality: N/A;   CORONARY ULTRASOUND/IVUS N/A 08/14/2020   Procedure: Intravascular Ultrasound/IVUS;  Surgeon: Dann Candyce RAMAN, MD;  Location: [facility] INVASIVE CV LAB;  Service: Cardiovascular;  Laterality: N/A;   LEFT HEART CATH AND CORONARY ANGIOGRAPHY N/A 08/14/2020   Procedure: LEFT HEART CATH AND CORONARY ANGIOGRAPHY;  Surgeon: Dann Candyce RAMAN, MD;  Location: [facility] INVASIVE CV LAB;  Service: Cardiovascular;  Laterality: N/A;    Prior to Admission medications   Medication Sig Start Date End Date Taking? Authorizing Provider   ALPRAZolam  (XANAX ) 0.5 MG tablet Take 0.5 mg by mouth daily as needed for anxiety.   Yes [provider]  aspirin  81 MG chewable tablet Chew 1 tablet (81 mg total) by mouth daily. 08/16/20  Yes Henry Shaver B, NP  atorvastatin  (LIPITOR ) 20 MG tablet TAKE 1 TABLET BY MOUTH DAILY 09/22/23  Yes Krasowski, Robert J, MD  BIOTIN PO Take 1 tablet by mouth every morning. Unknown strength   Yes [provider]  Cyanocobalamin  (VITAMIN B-12 PO) Take 1 tablet by mouth every morning. Unknown strength   Yes [provider]  DULoxetine (CYMBALTA) 30 MG capsule Take 30 mg by mouth daily. 12/22/23  Yes [provider]  fenoprofen (NALFON) 600 MG TABS tablet Take 600 mg by mouth every 6 (six) hours as needed.   Yes [provider]  hydrochlorothiazide (HYDRODIURIL) 25 MG tablet Take 1 tablet by mouth daily. 09/20/20  Yes [provider]  levothyroxine  (SYNTHROID ) 88 MCG tablet Take 50 mcg by mouth daily. 09/19/20  Yes [provider]  medroxyPROGESTERone (PROVERA) 2.5 MG tablet Take 2.5 mg by mouth every morning. 01/23/17  Yes [provider]  metoCLOPramide  (REGLAN ) 10 MG tablet Take 1  tablet 20-30 minutes before drinking colonoscopy prep. 01/02/24  Yes Beather Delon Gibson, PA  POTASSIUM PO Take 1 tablet by mouth every morning. Unknown strenght   Yes [provider]  Cholecalciferol (VITAMIN D3 PO) Take 1 tablet by mouth every morning. Unknown strength    [provider]  Potassium Chloride  ER 20 MEQ TBCR Take 2 tablets (40 mEq total) by mouth daily for 2 days. 01/01/24 01/03/24  Beather,  Delon Gibson, PA    Current Outpatient Medications  Medication Sig Dispense Refill   ALPRAZolam  (XANAX ) 0.5 MG tablet Take 0.5 mg by mouth daily as needed for anxiety.     aspirin  81 MG chewable tablet Chew 1 tablet (81 mg total) by mouth daily. 90 tablet 1   atorvastatin  (LIPITOR ) 20 MG tablet TAKE 1 TABLET BY MOUTH DAILY 90 tablet 3    BIOTIN PO Take 1 tablet by mouth every morning. Unknown strength     Cyanocobalamin  (VITAMIN B-12 PO) Take 1 tablet by mouth every morning. Unknown strength     DULoxetine (CYMBALTA) 30 MG capsule Take 30 mg by mouth daily.     fenoprofen (NALFON) 600 MG TABS tablet Take 600 mg by mouth every 6 (six) hours as needed.     hydrochlorothiazide (HYDRODIURIL) 25 MG tablet Take 1 tablet by mouth daily.     levothyroxine  (SYNTHROID ) 88 MCG tablet Take 50 mcg by mouth daily.     medroxyPROGESTERone (PROVERA) 2.5 MG tablet Take 2.5 mg by mouth every morning.     metoCLOPramide  (REGLAN ) 10 MG tablet Take 1  tablet 20-30 minutes before drinking colonoscopy prep. 3 tablet 0   POTASSIUM PO Take 1 tablet by mouth every morning. Unknown strenght     Cholecalciferol (VITAMIN D3 PO) Take 1 tablet by mouth every morning. Unknown strength     Potassium Chloride  ER 20 MEQ TBCR Take 2 tablets (40 mEq total) by mouth daily for 2 days. 4 tablet 0   Current Facility-Administered Medications  Medication Dose Route Frequency Provider Last Rate Last Admin   0.9 %  sodium chloride  infusion  500 mL Intravenous Once Duante Arocho V, MD        Allergies as of 01/15/2024   (No Known Allergies)    Family History  Problem Relation Age of Onset   Lung cancer Mother    Heart attack Father     Social History   Socioeconomic History   Marital status: Widowed    Spouse name: Not on file   Number of children: Not on file   Years of education: Not on file   Highest education level: Not on file  Occupational History   Not on file  Tobacco Use   Smoking status: Never   Smokeless tobacco: Never  Substance and Sexual Activity   Alcohol use: No   Drug use: No   Sexual activity: Not on file  Other Topics Concern   Not on file  Social History Narrative   Not on file   Social Drivers of Health   Financial Resource Strain: Not on file  Food Insecurity: Low Risk  (07/28/2023)   Received from [facility]    Hunger Vital Sign    Within the past 12 months, you worried that your food would run out before you got money to buy more: Never true    Within the past 12 months, the food you bought just didn't last and you didn't have money to get more. : Never true  Transportation Needs: No Transportation Needs (07/28/2023)   Received from [employer]    In the past 12 months, has lack of reliable transportation kept you from medical appointments, meetings, work or from getting things needed for daily living? : No  Physical Activity: Not on file  Stress: Not on file  Social Connections: Not on file  Intimate Partner Violence: Not on file    Review of Systems:  All other review  of systems negative except as mentioned in the HPI.  Physical Exam: Vital signs in last 24 hours: BP 127/71   Pulse 78   Temp 97.9 F (36.6 C) (Temporal)   Ht 5' 3 (1.6 m)   Wt 138 lb (62.6 kg)   SpO2 98%   BMI 24.45 kg/m  General:   Alert, NAD Lungs:  Clear .   Heart:  Regular rate and rhythm Abdomen:  Soft, nontender and nondistended. Neuro/Psych:  Alert and cooperative. Normal mood and affect. A and O x 3  Reviewed labs, radiology imaging, old records and pertinent past GI work up  Patient is appropriate for planned procedure(s) and anesthesia in an ambulatory setting   K. Veena Vanesa Renier , MD [phone]

## 2024-01-15 NOTE — Op Note (Signed)
 [facility] Patient Name: Kristi Burns Procedure Date: 01/15/2024 11:16 AM MRN: [id number] Endoscopist: Gustav ALONSO Mcgee , MD, [id number] Age: 78 Referring MD:  Date of Birth: Sep 23, 1945 Gender: Female Account #: [id number] Procedure:                Colonoscopy Indications:              Positive Cologuard test Medicines:                Monitored Anesthesia Care Procedure:                Pre-Anesthesia Assessment:                           - Prior to the procedure, a History and Physical                            was performed, and patient medications and                            allergies were reviewed. The patient's tolerance of                            previous anesthesia was also reviewed. The risks                            and benefits of the procedure and the sedation                            options and risks were discussed with the patient.                            All questions were answered, and informed consent                            was obtained. Prior Anticoagulants: The patient has                            taken no anticoagulant or antiplatelet agents. ASA                            Grade Assessment: III - A patient with severe                            systemic disease. After reviewing the risks and                            benefits, the patient was deemed in satisfactory                            condition to undergo the procedure.                           After obtaining informed consent, the colonoscope  was passed under direct vision. Throughout the                            procedure, the patient's blood pressure, pulse, and                            oxygen saturations were monitored continuously. The                            [id number]-[id number] Colonoscope [id number] was introduced                            through the anus and advanced to the the cecum,                            identified by appendiceal  orifice and ileocecal                            valve. The colonoscopy was performed without                            difficulty. The patient tolerated the procedure                            well. The quality of the bowel preparation was                            good. The ileocecal valve, appendiceal orifice, and                            rectum were photographed. Scope In: 11:21:42 AM Scope Out: 11:36:47 AM Scope Withdrawal Time: 0 hours 9 minutes 19 seconds  Total Procedure Duration: 0 hours 15 minutes 5 seconds  Findings:                 The perianal and digital rectal examinations were                            normal.                           A few small-mouthed diverticula were found in the                            sigmoid colon.                           Non-bleeding external and internal hemorrhoids were                            found during retroflexion. The hemorrhoids were                            medium-sized. Complications:            No immediate complications. Estimated Blood Loss:     Estimated  blood loss was minimal. Impression:               - Diverticulosis in the sigmoid colon.                           - Non-bleeding external and internal hemorrhoids.                           - No specimens collected. Recommendation:           - Patient has a contact number available for                            emergencies. The signs and symptoms of potential                            delayed complications were discussed with the                            patient. Return to normal activities tomorrow.                            Written discharge instructions were provided to the                            patient.                           - Resume previous diet.                           - Continue present medications.                           - No repeat colonoscopy due to age. [name] [name], MD 01/15/2024 11:41:19 AM This report has been signed  electronically.

## 2024-01-15 NOTE — Progress Notes (Unsigned)
 Report given to PACU, vss

## 2024-01-15 NOTE — Patient Instructions (Signed)
 Handouts on hemorrhoids and diverticulosis Resume previous diet and continue present medications No repeat colonoscopy for surveillance due to age   YOU HAD AN ENDOSCOPIC PROCEDURE TODAY AT THE [facility]:   Refer to the procedure report that was given to you for any specific questions about what was found during the examination.  If the procedure report does not answer your questions, please call your gastroenterologist to clarify.  If you requested that your care partner not be given the details of your procedure findings, then the procedure report has been included in a sealed envelope for you to review at your convenience later.  YOU SHOULD EXPECT: Some feelings of bloating in the abdomen. Passage of more gas than usual.  Walking can help get rid of the air that was put into your GI tract during the procedure and reduce the bloating. If you had a lower endoscopy (such as a colonoscopy or flexible sigmoidoscopy) you may notice spotting of blood in your stool or on the toilet paper. If you underwent a bowel prep for your procedure, you may not have a normal bowel movement for a few days.  Please Note:  You might notice some irritation and congestion in your nose or some drainage.  This is from the oxygen used during your procedure.  There is no need for concern and it should clear up in a day or so.  SYMPTOMS TO REPORT IMMEDIATELY:  Following lower endoscopy (colonoscopy or flexible sigmoidoscopy):  Excessive amounts of blood in the stool  Significant tenderness or worsening of abdominal pains  Swelling of the abdomen that is new, acute  Fever of 100F or higher  For urgent or emergent issues, a gastroenterologist can be reached at any hour by calling [phone]. Do not use MyChart messaging for urgent concerns.    DIET:  We do recommend a small meal at first, but then you may proceed to your regular diet.  Drink plenty of fluids but you should avoid alcoholic beverages for  24 hours.  ACTIVITY:  You should plan to take it easy for the rest of today and you should NOT DRIVE or use heavy machinery until tomorrow (because of the sedation medicines used during the test).    FOLLOW UP: Our staff will call the number listed on your records the next business day following your procedure.  We will call around 7:15- 8:00 am to check on you and address any questions or concerns that you may have regarding the information given to you following your procedure. If we do not reach you, we will leave a message.     If any biopsies were taken you will be contacted by phone or by letter within the next 1-3 weeks.  Please call us  at [phone] if you have not heard about the biopsies in 3 weeks.    SIGNATURES/CONFIDENTIALITY: You and/or your care partner have signed paperwork which will be entered into your electronic medical record.  These signatures attest to the fact that that the information above on your After Visit Summary has been reviewed and is understood.  Full responsibility of the confidentiality of this discharge information lies with you and/or your care-partner.

## 2024-01-16 ENCOUNTER — Telehealth: Payer: Self-pay | Admitting: Lactation Services

## 2024-01-16 NOTE — Telephone Encounter (Signed)
 No answer left voice mail

## 2024-03-22 ENCOUNTER — Telehealth: Payer: Self-pay | Admitting: Cardiology

## 2024-03-22 ENCOUNTER — Emergency Department (HOSPITAL_BASED_OUTPATIENT_CLINIC_OR_DEPARTMENT_OTHER)
Admission: EM | Admit: 2024-03-22 | Discharge: 2024-03-22 | Disposition: A | Discharge status: Home / Self Care | Attending: Emergency Medicine | Admitting: Emergency Medicine

## 2024-03-22 ENCOUNTER — Emergency Department (HOSPITAL_BASED_OUTPATIENT_CLINIC_OR_DEPARTMENT_OTHER): Admitting: Radiology

## 2024-03-22 ENCOUNTER — Other Ambulatory Visit: Payer: Self-pay

## 2024-03-22 DIAGNOSIS — N179 Acute kidney failure, unspecified: Secondary | ICD-10-CM | POA: Insufficient documentation

## 2024-03-22 DIAGNOSIS — J449 Chronic obstructive pulmonary disease, unspecified: Secondary | ICD-10-CM | POA: Insufficient documentation

## 2024-03-22 DIAGNOSIS — I10 Essential (primary) hypertension: Secondary | ICD-10-CM | POA: Insufficient documentation

## 2024-03-22 DIAGNOSIS — Z7982 Long term (current) use of aspirin: Secondary | ICD-10-CM | POA: Diagnosis not present

## 2024-03-22 DIAGNOSIS — E039 Hypothyroidism, unspecified: Secondary | ICD-10-CM | POA: Insufficient documentation

## 2024-03-22 DIAGNOSIS — D72829 Elevated white blood cell count, unspecified: Secondary | ICD-10-CM | POA: Diagnosis not present

## 2024-03-22 DIAGNOSIS — R0602 Shortness of breath: Secondary | ICD-10-CM | POA: Insufficient documentation

## 2024-03-22 DIAGNOSIS — I2510 Atherosclerotic heart disease of native coronary artery without angina pectoris: Secondary | ICD-10-CM | POA: Insufficient documentation

## 2024-03-22 LAB — BASIC METABOLIC PANEL WITH GFR
Anion gap: 13 (ref 5–15)
BUN: 34 mg/dL — ABNORMAL HIGH (ref 8–23)
CO2: 24 mmol/L (ref 22–32)
Calcium: 9.4 mg/dL (ref 8.9–10.3)
Chloride: 100 mmol/L (ref 98–111)
Creatinine, Ser: 2.26 mg/dL — ABNORMAL HIGH (ref 0.44–1.00)
GFR, Estimated: 22 mL/min — ABNORMAL LOW
Glucose, Bld: 132 mg/dL — ABNORMAL HIGH (ref 70–99)
Potassium: 3.7 mmol/L (ref 3.5–5.1)
Sodium: 136 mmol/L (ref 135–145)

## 2024-03-22 LAB — CBC
HCT: 35.8 % — ABNORMAL LOW (ref 36.0–46.0)
Hemoglobin: 12.2 g/dL (ref 12.0–15.0)
MCH: 31.4 pg (ref 26.0–34.0)
MCHC: 34.1 g/dL (ref 30.0–36.0)
MCV: 92 fL (ref 80.0–100.0)
Platelets: 179 K/uL (ref 150–400)
RBC: 3.89 MIL/uL (ref 3.87–5.11)
RDW: 15.3 % (ref 11.5–15.5)
WBC: 13.4 K/uL — ABNORMAL HIGH (ref 4.0–10.5)
nRBC: 0 % (ref 0.0–0.2)

## 2024-03-22 LAB — URINALYSIS, ROUTINE W REFLEX MICROSCOPIC
Bilirubin Urine: NEGATIVE
Glucose, UA: NEGATIVE mg/dL
Hgb urine dipstick: NEGATIVE
Ketones, ur: NEGATIVE mg/dL
Leukocytes,Ua: NEGATIVE
Nitrite: NEGATIVE
Protein, ur: NEGATIVE mg/dL
Specific Gravity, Urine: 1.009 (ref 1.005–1.030)
pH: 7.5 (ref 5.0–8.0)

## 2024-03-22 LAB — PRO BRAIN NATRIURETIC PEPTIDE: Pro Brain Natriuretic Peptide: 772 pg/mL — ABNORMAL HIGH (ref <300.0)

## 2024-03-22 LAB — TROPONIN T, HIGH SENSITIVITY
Troponin T High Sensitivity: <15 ng/L (ref 0–19)
Troponin T High Sensitivity: <15 ng/L (ref 0–19)

## 2024-03-22 LAB — RESP PANEL BY RT-PCR (RSV, FLU A&B, COVID)  RVPGX2
Influenza A by PCR: NEGATIVE
Influenza B by PCR: NEGATIVE
Resp Syncytial Virus by PCR: NEGATIVE
SARS Coronavirus 2 by RT PCR: NEGATIVE

## 2024-03-22 MED ORDER — MIDAZOLAM HCL 2 MG/2ML IJ SOLN
INTRAMUSCULAR | Status: AC
  Filled 2024-03-22: qty 2

## 2024-03-22 MED ORDER — ROCURONIUM BROMIDE 10 MG/ML (PF) SYRINGE
PREFILLED_SYRINGE | INTRAVENOUS | Status: AC
  Filled 2024-03-22: qty 10

## 2024-03-22 MED ORDER — SODIUM CHLORIDE 0.9 % IV BOLUS
1000.0000 mL | Freq: Once | INTRAVENOUS | Status: AC
  Administered 2024-03-22: 1000 mL via INTRAVENOUS

## 2024-03-22 MED ORDER — FENTANYL CITRATE PF 50 MCG/ML IJ SOSY
PREFILLED_SYRINGE | INTRAMUSCULAR | Status: AC
  Filled 2024-03-22: qty 2

## 2024-03-22 MED ORDER — SUCCINYLCHOLINE CHLORIDE 200 MG/10ML IV SOSY
PREFILLED_SYRINGE | INTRAVENOUS | Status: AC
  Filled 2024-03-22: qty 10

## 2024-03-22 MED ORDER — ETOMIDATE 2 MG/ML IV SOLN
INTRAVENOUS | Status: AC
  Filled 2024-03-22: qty 20

## 2024-03-22 NOTE — ED Provider Notes (Signed)
 [place] EMERGENCY DEPARTMENT AT [facility] Provider Note   CSN: [id number] Arrival date & time: 03/22/24  1142     Patient presents with: Shortness of Breath   Kristi Burns is a 78 y.o. female with history of NSTEMI, PAD, COPD presents with complaints of shortness of breath, dizziness, nausea and diaphoresis.  Patient states symptoms started yesterday.  States it feels similar to when she had a NSTEMI in 2022.  She denies any chest pain with this.  Has had a cough and sore throat that started yesterday as well.  Denies any abdominal pain, vomiting or diarrhea.  No history of blood clots.  Does report that [place]    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   Hypothyroidism (acquired) 11/15/2015   Malaise and fatigue 11/15/2015   Mixed hyperlipidemia 11/15/2015   NSTEMI (non-ST elevated myocardial infarction) (HCC) 08/13/2020   Peripheral vascular disease 11/15/2015   Past Surgical History:  Procedure Laterality Date   CORONARY STENT INTERVENTION N/A 08/14/2020   Procedure: CORONARY STENT INTERVENTION;  Surgeon: Dann Candyce RAMAN, MD;  Location: [facility] INVASIVE CV LAB;  Service: Cardiovascular;  Laterality: N/A;   CORONARY ULTRASOUND/IVUS N/A 08/14/2020   Procedure: Intravascular Ultrasound/IVUS;  Surgeon: Dann Candyce RAMAN, MD;  Location: [facility] INVASIVE CV LAB;  Service: Cardiovascular;  Laterality: N/A;   LEFT HEART CATH AND CORONARY ANGIOGRAPHY N/A 08/14/2020   Procedure: LEFT HEART CATH AND CORONARY ANGIOGRAPHY;   Surgeon: Dann Candyce RAMAN, MD;  Location: [facility] INVASIVE CV LAB;  Service: Cardiovascular;  Laterality: N/A;     Prior to Admission medications   Medication Sig Start Date End Date Taking? Authorizing Provider  ALPRAZolam  (XANAX ) 0.5 MG tablet Take 0.5 mg by mouth daily as needed for anxiety.    [provider]  aspirin  81 MG chewable tablet Chew 1 tablet (81 mg total) by mouth daily. 08/16/20   Henry Manuelita NOVAK, NP  atorvastatin  (LIPITOR ) 20 MG tablet TAKE 1 TABLET BY MOUTH DAILY 09/22/23   Krasowski, Robert J, MD  BIOTIN PO Take 1 tablet by mouth every morning. Unknown strength    [provider]  Cholecalciferol (VITAMIN D3 PO) Take 1 tablet by mouth every morning. Unknown strength    [provider]  Cyanocobalamin  (VITAMIN B-12 PO) Take 1 tablet by mouth every morning. Unknown strength    [provider]  DULoxetine (CYMBALTA) 30 MG capsule Take 30 mg by mouth daily. 12/22/23   [provider]  fenoprofen (NALFON) 600 MG TABS tablet Take 600 mg by mouth every 6 (six) hours as needed.    [provider]  hydrochlorothiazide (HYDRODIURIL) 25 MG tablet Take 1 tablet by mouth daily. 09/20/20   [provider]  levothyroxine  (SYNTHROID ) 88 MCG tablet Take 50 mcg by mouth daily. 09/19/20   [provider]  medroxyPROGESTERone (PROVERA) 2.5 MG tablet Take 2.5 mg by mouth every morning. 01/23/17   [provider]  metoCLOPramide  (REGLAN ) 10 MG tablet Take 1  tablet 20-30 minutes before drinking colonoscopy prep. 01/02/24  Beather Delon Gibson, [place]  Potassium Chloride  ER 20 MEQ TBCR Take 2 tablets (40 mEq total) by mouth daily for 2 days. 01/01/24 01/03/24  Beather Delon Gibson, PA  POTASSIUM PO Take 1 tablet by mouth every morning. Unknown strenght    [provider]    Allergies: Patient has no known allergies.    Review of Systems  Respiratory:  Positive for shortness of breath.     Updated Vital Signs BP  123/62   Pulse 66   Temp 97.7 F (36.5 C)   Resp 17   SpO2 100%   Physical Exam Vitals and nursing note reviewed.  Constitutional:      General: She is not in acute distress.    Appearance: She is well-developed.  HENT:     Head: Normocephalic and atraumatic.  Eyes:     Conjunctiva/sclera: Conjunctivae normal.  Cardiovascular:     Rate and Rhythm: Normal rate and regular rhythm.     Heart sounds: No murmur heard. Pulmonary:     Effort: Pulmonary effort is normal. No respiratory distress.     Breath sounds: Normal breath sounds.  Abdominal:     Palpations: Abdomen is soft.     Tenderness: There is no abdominal tenderness.  Musculoskeletal:        General: No swelling.     Cervical back: Neck supple.  Skin:    General: Skin is warm and dry.     Capillary Refill: Capillary refill takes less than 2 seconds.  Neurological:     Mental Status: She is alert.  Psychiatric:        Mood and Affect: Mood normal.     (all labs ordered are listed, but only abnormal results are displayed) Labs Reviewed  BASIC METABOLIC PANEL WITH GFR - Abnormal; Notable for the following components:      Result Value   Glucose, Bld 132 (*)    BUN 34 (*)    Creatinine, Ser 2.26 (*)    GFR, Estimated 22 (*)    All other components within normal limits  CBC - Abnormal; Notable for the following components:   WBC 13.4 (*)    HCT 35.8 (*)    All other components within normal limits  PRO BRAIN NATRIURETIC PEPTIDE - Abnormal; Notable for the following components:   Pro Brain Natriuretic Peptide 772.0 (*)    All other components within normal limits  RESP PANEL BY RT-PCR (RSV, FLU A&B, COVID)  RVPGX2  URINALYSIS, ROUTINE W REFLEX MICROSCOPIC  TROPONIN T, HIGH SENSITIVITY  TROPONIN T, HIGH SENSITIVITY    EKG: EKG Interpretation Date/Time:  Monday March 22 2024 12:07:39 EDT Ventricular Rate:  104 PR Interval:  144 QRS Duration:  66 QT Interval:  308 QTC Calculation: 405 R  Axis:   62  Text Interpretation: Sinus tachycardia Otherwise normal ECG Confirmed by Ruthe Cornet [phone]) on 03/22/2024 12:11:11 PM  Radiology: [facility] Chest 2 View Result Date: 03/22/2024 CLINICAL DATA:  Chest pain and shortness of breath. EXAM: CHEST - 2 VIEW COMPARISON:  None Available. FINDINGS: The cardiomediastinal contours are normal. Coronary stent visualized. The lungs are clear. Pulmonary vasculature is normal. No consolidation, pleural effusion, or pneumothorax. No acute osseous abnormalities are seen. Partially included lumbar fusion hardware appear IMPRESSION: No acute chest findings. Electronically Signed   By: Andrea Gasman M.D.   On: 03/22/2024 13:15     Procedures   Medications Ordered in the ED  midazolam  (VERSED ) 2 MG/2ML injection (has no administration in time  range)  etomidate (AMIDATE) 2 MG/ML injection (has no administration in time range)  succinylcholine (ANECTINE) 200 MG/10ML syringe (has no administration in time range)  rocuronium (ZEMURON) 100 MG/10ML injection (has no administration in time range)  fentaNYL  (SUBLIMAZE ) 50 MCG/ML injection (has no administration in time range)  sodium chloride  0.9 % bolus 1,000 mL (1,000 mLs Intravenous New Bag/Given 03/22/24 1348)    Clinical Course as of 03/22/24 1604  Mon Mar 22, 2024  1230 Patient with history of NSTEMI and COPD evaluated for complaints of shortness of breath, dizziness, nausea and diaphoresis that started yesterday.  She is hemodynamically stable.  Her lungs are clear.  She is currently chest pain-free.  Does note that she additionally had a new cough and sore throat that developed yesterday as well.  Will obtain respiratory panel, routine labs and cardiac workup. [JT]  1254 CBC(!) Leukocytosis of 13.4 [JT]  1254 Basic metabolic panel(!) AKI with creatinine of 2.26, was 1.13 a month ago [JT]  1256 Pro Brain natriuretic peptide(!) Mildly elevated to 772 [JT]  1256 Troponin T, High Sensitivity Within  normal limits  [JT]  1353 Resp panel by RT-PCR (RSV, Flu A&B, Covid) Anterior Nasal Swab Negative [JT]  1434 Troponin T, High Sensitivity Delta troponin without elevation.  [JT]  1437 Reviewed workup with patient and her daughter.  Patient does have an AKI, however she is tolerating p.o. no clear etiology for AKI at this time.  Recommended admission, however patient would prefer discharge.  I have recommended close PCP follow-up within the next 48 hours for repeat BMP.  Have additionally recommended hydration and holding hydrochlorothiazide.  Family are at bedside and are supportive.  In agreement with plan.  Strict return precautions provided.  Will send in urine to assist in outpatient management. [JT]  1558 Urinalysis, Routine w reflex microscopic -Urine, Clean Catch(!) No evidence of UTI [JT]    Clinical Course User Index [JT] Donnajean Lynwood DEL, PA-C                                 Medical Decision Making Amount and/or Complexity of Data Reviewed Labs: ordered. Decision-making details documented in ED Course. Radiology: ordered.   This patient presents to the ED with chief complaint(s) of shortness of breath.  The complaint involves an extensive differential diagnosis and also carries with it a high risk of complications and morbidity.   Pertinent past medical history as listed in HPI  The differential diagnosis includes  Considered ACS however patient has no EKG changes and her troponin is without elevation.  She does not appear to be overloaded.  She has no chest pain to suggest aortic dissection.   She does have a bit of a white count and has had URI symptoms in the past 2 days however no evidence of pneumonia on x-ray.    Additional history obtained: Records reviewed Care Everywhere/External Records  Disposition:   Patient will be discharged home. The patient has been appropriately medically screened and/or stabilized in the ED. I have low suspicion for any other emergent  medical condition which would require further screening, evaluation or treatment in the ED or require inpatient management. At time of discharge the patient is hemodynamically stable and in no acute distress. I have discussed work-up results and diagnosis with patient and answered all questions. Patient is agreeable with discharge plan. We discussed strict return precautions for returning to the emergency department and they  verbalized understanding.     Social Determinants of Health:   none  This note was dictated with voice recognition software.  Despite best efforts at proofreading, errors may have occurred which can change the documentation meaning.       Final diagnoses:  Shortness of breath  AKI (acute kidney injury)    ED Discharge Orders     None          Donnajean Lynwood DEL, PA-C 03/22/24 1613    Ruthe Cornet, DO 03/23/24 [phone]

## 2024-03-22 NOTE — ED Triage Notes (Signed)
 Patient states shortness of breath worsening upon exertion. Bilateral pain to shoulder blades. Hx of Nstemi several years ago and states it feels similar.

## 2024-03-22 NOTE — Telephone Encounter (Signed)
 Patient c/o Palpitations: STAT if patient c/o lightheadedness, shortness of breath, or chest pain  How long have you had palpitations/irregular HR/ Afib? Are you having the symptoms now? Since waking up this morning   Are you currently experiencing lightheadedness, SOB or CP? SOB   Do you have a history of afib (atrial fibrillation) or irregular heart rhythm? No  Have you checked your BP or HR? (document readings if available): No  Are you experiencing any other symptoms? Been feeling weak for 4 to 5 days and feelings as though her head is floating somewhere else.   Pt c/o Shortness Of Breath: STAT if SOB developed within the last 24 hours or pt is noticeably SOB on the phone  1. Are you currently SOB (can you hear that pt is SOB on the phone)? No  2. How long have you been experiencing SOB? This morning   3. Are you SOB when sitting or when up moving around? A little of both   4. Are you currently experiencing any other symptoms? No, but having night time leg cramps, neck and should pains  Pt's daughter Andrez called (from work so she's not with pt) and is requesting a callback regarding wanting a nurse visit. Please advise

## 2024-03-22 NOTE — Discharge Instructions (Addendum)
 You were evaluated in the emergency room for shortness of breath.  As discussed you had evidence of decreased kidney function.  Please be sure to drink plenty of fluids over the next 3 days.  Please hold your hydrochlorothiazide until you follow-up with your primary care doctor.  Please contact your primary care doctor today for an appointment within the next few days and to recheck your kidney function.  If you experience any new or worsening symptoms please return emergency room.

## 2024-03-22 NOTE — Telephone Encounter (Signed)
 Spoke with pts daughter. She stated that her mother was having shortness of breath, dizziness and sweating when she gets up, pain in her neck and upper shoulders. Advised to go to the ED for evaluation. Daughter verbalized understanding and had no further questions.

## 2024-03-22 NOTE — ED Notes (Signed)
 Pt d/c instructions, medications, and follow-up care reviewed with pt. Pt verbalized understanding and had no further questions at time of d/c. Pt CA&Ox4, ambulatory, and in NAD at time of d/c

## 2024-04-12 ENCOUNTER — Ambulatory Visit: Payer: Self-pay | Admitting: Cardiology

## 2024-04-13 ENCOUNTER — Telehealth: Payer: Self-pay

## 2024-04-13 NOTE — Telephone Encounter (Signed)
 Left message on My Chart with Echo results per Dr. Karry note. Routed to PCP.

## 2024-04-14 ENCOUNTER — Telehealth: Payer: Self-pay

## 2024-04-14 NOTE — Telephone Encounter (Signed)
 Pt viewed stress Echo on My Chart per Dr. Karry note. Routed to PCP.

## 2024-04-19 ENCOUNTER — Encounter: Payer: Self-pay | Admitting: Radiology

## 2024-06-01 ENCOUNTER — Other Ambulatory Visit: Payer: Self-pay | Admitting: Cardiology

## 2024-07-15 ENCOUNTER — Other Ambulatory Visit: Payer: Self-pay | Admitting: Cardiology
